# Patient Record
Sex: Male | Born: 1975 | Race: White | Hispanic: No | Marital: Married | Smoking: Former smoker
Health system: Southern US, Community
[De-identification: ages and names within clinical notes are randomized; demographics above are authoritative.]

## PROBLEM LIST (undated history)

## (undated) ENCOUNTER — Ambulatory Visit (HOSPITAL_COMMUNITY): Admission: EM

## (undated) DIAGNOSIS — I10 Essential (primary) hypertension: Secondary | ICD-10-CM

## (undated) DIAGNOSIS — J45909 Unspecified asthma, uncomplicated: Secondary | ICD-10-CM

## (undated) DIAGNOSIS — E785 Hyperlipidemia, unspecified: Secondary | ICD-10-CM

## (undated) DIAGNOSIS — G4730 Sleep apnea, unspecified: Secondary | ICD-10-CM

## (undated) DIAGNOSIS — D751 Secondary polycythemia: Secondary | ICD-10-CM

## (undated) HISTORY — PX: HERNIA REPAIR: SHX51

## (undated) HISTORY — DX: Hyperlipidemia, unspecified: E78.5

## (undated) HISTORY — DX: Essential (primary) hypertension: I10

## (undated) HISTORY — DX: Unspecified asthma, uncomplicated: J45.909

## (undated) HISTORY — DX: Sleep apnea, unspecified: G47.30

---

## 2008-02-18 HISTORY — PX: CARPAL TUNNEL RELEASE: SHX101

## 2019-03-21 ENCOUNTER — Other Ambulatory Visit: Payer: Self-pay

## 2019-03-22 ENCOUNTER — Encounter: Payer: Self-pay | Admitting: Family Medicine

## 2019-03-22 ENCOUNTER — Telehealth: Payer: Self-pay

## 2019-03-22 ENCOUNTER — Ambulatory Visit (INDEPENDENT_AMBULATORY_CARE_PROVIDER_SITE_OTHER): Payer: 59 | Admitting: Family Medicine

## 2019-03-22 VITALS — BP 134/90 | HR 86 | Temp 97.2°F | Ht 71.0 in | Wt 258.2 lb

## 2019-03-22 DIAGNOSIS — R7989 Other specified abnormal findings of blood chemistry: Secondary | ICD-10-CM

## 2019-03-22 DIAGNOSIS — J4530 Mild persistent asthma, uncomplicated: Secondary | ICD-10-CM | POA: Diagnosis not present

## 2019-03-22 DIAGNOSIS — J45909 Unspecified asthma, uncomplicated: Secondary | ICD-10-CM | POA: Insufficient documentation

## 2019-03-22 DIAGNOSIS — Z20822 Contact with and (suspected) exposure to covid-19: Secondary | ICD-10-CM | POA: Diagnosis not present

## 2019-03-22 DIAGNOSIS — N5201 Erectile dysfunction due to arterial insufficiency: Secondary | ICD-10-CM | POA: Diagnosis not present

## 2019-03-22 DIAGNOSIS — Z0000 Encounter for general adult medical examination without abnormal findings: Secondary | ICD-10-CM | POA: Diagnosis not present

## 2019-03-22 DIAGNOSIS — Z23 Encounter for immunization: Secondary | ICD-10-CM | POA: Diagnosis not present

## 2019-03-22 DIAGNOSIS — K4090 Unilateral inguinal hernia, without obstruction or gangrene, not specified as recurrent: Secondary | ICD-10-CM | POA: Diagnosis not present

## 2019-03-22 DIAGNOSIS — E7800 Pure hypercholesterolemia, unspecified: Secondary | ICD-10-CM

## 2019-03-22 LAB — URINALYSIS, ROUTINE W REFLEX MICROSCOPIC
Bilirubin Urine: NEGATIVE
Hgb urine dipstick: NEGATIVE
Ketones, ur: 40 — AB
Leukocytes,Ua: NEGATIVE
Nitrite: NEGATIVE
RBC / HPF: NONE SEEN (ref 0–?)
Specific Gravity, Urine: 1.01 (ref 1.000–1.030)
Total Protein, Urine: NEGATIVE
Urine Glucose: NEGATIVE
Urobilinogen, UA: 0.2 (ref 0.0–1.0)
WBC, UA: NONE SEEN (ref 0–?)
pH: 7 (ref 5.0–8.0)

## 2019-03-22 LAB — TSH: TSH: 2.78 u[IU]/mL (ref 0.35–4.50)

## 2019-03-22 LAB — COMPREHENSIVE METABOLIC PANEL
ALT: 136 U/L — ABNORMAL HIGH (ref 0–53)
AST: 99 U/L — ABNORMAL HIGH (ref 0–37)
Albumin: 4.6 g/dL (ref 3.5–5.2)
Alkaline Phosphatase: 52 U/L (ref 39–117)
BUN: 8 mg/dL (ref 6–23)
CO2: 27 mEq/L (ref 19–32)
Calcium: 9.3 mg/dL (ref 8.4–10.5)
Chloride: 97 mEq/L (ref 96–112)
Creatinine, Ser: 0.75 mg/dL (ref 0.40–1.50)
GFR: 112.57 mL/min (ref >60.00)
Glucose, Bld: 102 mg/dL — ABNORMAL HIGH (ref 70–99)
Potassium: 4 mEq/L (ref 3.5–5.1)
Sodium: 134 mEq/L — ABNORMAL LOW (ref 135–145)
Total Bilirubin: 0.7 mg/dL (ref 0.2–1.2)
Total Protein: 7 g/dL (ref 6.0–8.3)

## 2019-03-22 LAB — CBC
HCT: 45.9 % (ref 39.0–52.0)
Hemoglobin: 15.8 g/dL (ref 13.0–17.0)
MCHC: 34.4 g/dL (ref 30.0–36.0)
MCV: 99.4 fl (ref 78.0–100.0)
Platelets: 199 10*3/uL (ref 150.0–400.0)
RBC: 4.62 Mil/uL (ref 4.22–5.81)
RDW: 13.6 % (ref 11.5–15.5)
WBC: 4.1 10*3/uL (ref 4.0–10.5)

## 2019-03-22 LAB — LIPID PANEL
Cholesterol: 254 mg/dL — ABNORMAL HIGH (ref 0–200)
HDL: 72.4 mg/dL (ref >39.00)
LDL Cholesterol: 167 mg/dL — ABNORMAL HIGH (ref 0–99)
NonHDL: 181.49
Total CHOL/HDL Ratio: 4
Triglycerides: 72 mg/dL (ref 0.0–149.0)
VLDL: 14.4 mg/dL (ref 0.0–40.0)

## 2019-03-22 LAB — LDL CHOLESTEROL, DIRECT: Direct LDL: 157 mg/dL

## 2019-03-22 MED ORDER — ALBUTEROL SULFATE HFA 108 (90 BASE) MCG/ACT IN AERS: 1.0000 | INHALATION_SPRAY | Freq: Four times a day (QID) | RESPIRATORY_TRACT | 0 refills | Status: DC | PRN

## 2019-03-22 MED ORDER — PREDNISONE 20 MG PO TABS: 20.0000 mg | ORAL_TABLET | Freq: Two times a day (BID) | ORAL | 0 refills | Status: AC

## 2019-03-22 MED ORDER — SILDENAFIL CITRATE 20 MG PO TABS: ORAL_TABLET | ORAL | 1 refills | Status: DC

## 2019-03-22 NOTE — Progress Notes (Addendum)
New Patient Office Visit  Subjective:  Patient ID: Jordan Smith, male    DOB: 02-03-1976  Age: 44 y.o. MRN: [id number]  CC:  Chief Complaint  Patient presents with  . Establish Care    pt states that he was exposed to covid back in July tested negative few days after he had a fever of 103 x 4 days. Pt now diagnosed with asthma due to breathing issues and garling feeling in throat pt would like hernia on right side checked     HPI Brenden Manes presents for establishment of care, complete physical exam and follow-up for some medical issues that he has been having.  Currently he lives with his wife.  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  Has been able to see the dentist and the eye doctor over this past year.  He quit smoking 3 years ago.  It he has 1-2 drinks weekly.  He does not use illicit drugs.  Not currently exercising.  Developed a febrile illness back in July.  Was directly exposed to a person with Covid.  Since that time he has been experiencing wheezing.  He has no history of asthma.  Denies any lingering fever or sputum production.  He is using an albuterol inhaler daily.  He does snore at night.  He feels rested in the morning.  He also has been having problems with ED.  Libido has been okay.  Father's health history is unknown.  Mom has suffered a cerebral aneurysm encephalitis.  History reviewed. No pertinent past medical history.  Past Surgical History:  Procedure Laterality Date  . HERNIA REPAIR Left     Family History  Problem Relation Age of Onset  . Aneurysm Mother   . Hypothyroidism Mother   . Vitamin D deficiency Sister   . Cancer Maternal Aunt   . Cancer Maternal Uncle   . Glaucoma Maternal Grandfather     Social History   Socioeconomic History  . Marital status: Married    Spouse name: Not on file  . Number of children: Not on file  . Years of education: Not on file  . Highest education level: Not on file  Occupational History  . Not on file  Tobacco  Use  . Smoking status: Former Research scientist (life sciences)  . Smokeless tobacco: Former Network engineer and Sexual Activity  . Alcohol use: Yes    Alcohol/week: 1.0 - 2.0 standard drinks    Types: 1 - 2 Standard drinks or equivalent per week    Comment: social  . Drug use: Never  . Sexual activity: Yes  Other Topics Concern  . Not on file  Social History Narrative  . Not on file   Social Determinants of Health   Financial Resource Strain:   . Difficulty of Paying Living Expenses: Not on file  Food Insecurity:   . Worried About Charity fundraiser in the Last Year: Not on file  . Ran Out of Food in the Last Year: Not on file  Transportation Needs:   . Lack of Transportation (Medical): Not on file  . Lack of Transportation (Non-Medical): Not on file  Physical Activity:   . Days of Exercise per Week: Not on file  . Minutes of Exercise per Session: Not on file  Stress:   . Feeling of Stress : Not on file  Social Connections:   . Frequency of Communication with Friends and Family: Not on file  . Frequency of Social Gatherings with Friends and Family:  Not on file  . Attends Religious Services: Not on file  . Active Member of Clubs or Organizations: Not on file  . Attends Archivist Meetings: Not on file  . Marital Status: Not on file  Intimate Partner Violence:   . Fear of Current or Ex-Partner: Not on file  . Emotionally Abused: Not on file  . Physically Abused: Not on file  . Sexually Abused: Not on file    ROS Review of Systems  Constitutional: Negative for diaphoresis, fatigue, fever and unexpected weight change.  HENT: Negative.   Eyes: Negative for photophobia and visual disturbance.  Respiratory: Positive for cough and wheezing. Negative for shortness of breath.   Cardiovascular: Negative.   Gastrointestinal: Negative.   Endocrine: Negative for polyphagia and polyuria.  Genitourinary: Negative for difficulty urinating, frequency and urgency.  Musculoskeletal: Negative for  gait problem and joint swelling.  Skin: Negative for pallor and rash.  Allergic/Immunologic: Negative for immunocompromised state.  Neurological: Negative for tremors and speech difficulty.  Hematological: Does not bruise/bleed easily.  Psychiatric/Behavioral: Negative.     Objective:   Today's Vitals: BP 134/90   Pulse 86   Temp (!) 97.2 F (36.2 C) (Tympanic)   Ht 5\' 11"  (1.803 m)   Wt 258 lb 3.2 oz (117.1 kg)   SpO2 97%   BMI 36.01 kg/m   Physical Exam Vitals and nursing note reviewed.  Constitutional:      General: He is not in acute distress.    Appearance: Normal appearance. He is not ill-appearing, toxic-appearing or diaphoretic.  HENT:     Head: Normocephalic and atraumatic.     Right Ear: Tympanic membrane and ear canal normal.     Left Ear: Tympanic membrane and ear canal normal.     Mouth/Throat:     Mouth: Mucous membranes are dry.     Pharynx: Oropharynx is clear. No oropharyngeal exudate or posterior oropharyngeal erythema.  Eyes:     General: No scleral icterus.       Right eye: No discharge.        Left eye: No discharge.     Extraocular Movements: Extraocular movements intact.     Conjunctiva/sclera: Conjunctivae normal.     Pupils: Pupils are equal, round, and reactive to light.  Neck:     Vascular: No carotid bruit.  Cardiovascular:     Rate and Rhythm: Normal rate and regular rhythm.  Pulmonary:     Effort: No respiratory distress.     Breath sounds: No stridor. No wheezing, rhonchi or rales.  Chest:     Chest wall: No tenderness.  Abdominal:     General: Abdomen is flat. Bowel sounds are normal. There is no distension.     Palpations: Abdomen is soft.     Tenderness: There is no abdominal tenderness. There is no guarding or rebound.     Hernia: A hernia is present. Hernia is present in the right inguinal area. There is no hernia in the left inguinal area.  Genitourinary:    Penis: Circumcised. No hypospadias, erythema, tenderness, discharge,  swelling or lesions.      Testes: Normal.        Right: Mass, tenderness or swelling not present. Right testis is descended.        Left: Tenderness or swelling not present. Left testis is descended.  Musculoskeletal:     Cervical back: Neck supple. No rigidity.     Right lower leg: No edema.     Left lower  leg: No edema.  Lymphadenopathy:     Cervical: No cervical adenopathy.     Lower Body: No right inguinal adenopathy. No left inguinal adenopathy.  Skin:    General: Skin is warm and dry.  Neurological:     Mental Status: He is oriented to person, place, and time.  Psychiatric:        Mood and Affect: Mood normal.        Behavior: Behavior normal.     Assessment & Plan:   Problem List Items Addressed This Visit      Cardiovascular and Mediastinum   Erectile dysfunction due to arterial insufficiency   Relevant Medications   sildenafil (REVATIO) 20 MG tablet   Other Relevant Orders   LDL cholesterol, direct (Completed)   Lipid panel (Completed)   Testosterone     Respiratory   Reactive airway disease   Relevant Medications   predniSONE (DELTASONE) 20 MG tablet   albuterol (VENTOLIN HFA) 108 (90 Base) MCG/ACT inhaler   Other Relevant Orders   Ambulatory referral to Pulmonology     Other   Unilateral inguinal hernia without obstruction or gangrene   Relevant Orders   Ambulatory referral to [place] maintenance - Primary   Relevant Orders   CBC (Completed)   Comprehensive metabolic panel (Completed)   LDL cholesterol, direct (Completed)   Lipid panel (Completed)   TSH (Completed)   Urinalysis, Routine w reflex microscopic (Completed)   HIV Antibody (routine testing w rflx)   Flu Vaccine QUAD 6+ mos PF IM (Fluarix Quad PF) (Completed)   Tdap vaccine greater than or equal to 7yo IM (Completed)   Elevated LFTs   Relevant Orders   US Abdomen Complete   Close exposure to COVID-19 virus   Relevant Orders   SAR CoV2 Serology (COVID 19)AB(IGG)IA     Elevated LDL cholesterol level      Outpatient Encounter Medications as of 03/22/2019  Medication Sig  . cetirizine (ZYRTEC) 10 MG tablet Take 10 mg by mouth daily.  . [DISCONTINUED] albuterol (VENTOLIN HFA) 108 (90 Base) MCG/ACT inhaler Inhale 1 puff into the lungs every 6 (six) hours as needed for wheezing or shortness of breath.  Marland Kitchen albuterol (VENTOLIN HFA) 108 (90 Base) MCG/ACT inhaler Inhale 1-2 puffs into the lungs every 6 (six) hours as needed for wheezing or shortness of breath.  . predniSONE (DELTASONE) 20 MG tablet Take 1 tablet (20 mg total) by mouth 2 (two) times daily with a meal for 7 days.  . sildenafil (REVATIO) 20 MG tablet Take one to three tablets prior daily as needed.  . [DISCONTINUED] albuterol (VENTOLIN HFA) 108 (90 Base) MCG/ACT inhaler Inhale 90 puffs into the lungs daily.   No facility-administered encounter medications on file as of 03/22/2019.    Follow-up: Return in about 3 months (around 06/19/2019), or if symptoms worsen or fail to improve.   Patient was given information on health maintenance and disease prevention.  Referred to pulmonology for evaluation of sleep apnea and COPD.  Consider the possibility of post Covid reactive airway disease.  General surgery evaluation for right inguinal bulge.  Refilled albuterol inhaler.  We will try prednisone 20 mg twice daily for the reactive airway disease.  Patient has taken prednisone before.  Libby Maw, MD

## 2019-03-22 NOTE — Addendum Note (Signed)
Addended by: Jon Billings on: 03/22/2019 02:38 PM   Modules accepted: Orders

## 2019-03-22 NOTE — Patient Instructions (Signed)
Health Maintenance, Male Adopting a healthy lifestyle and getting preventive care are important in promoting health and wellness. Ask your health care provider about:  The right schedule for you to have regular tests and exams.  Things you can do on your own to prevent diseases and keep yourself healthy. What should I know about diet, weight, and exercise? Eat a healthy diet   Eat a diet that includes plenty of vegetables, fruits, low-fat dairy products, and lean protein.  Do not eat a lot of foods that are high in solid fats, added sugars, or sodium. Maintain a healthy weight Body mass index (BMI) is a measurement that can be used to identify possible weight problems. It estimates body fat based on height and weight. Your health care provider can help determine your BMI and help you achieve or maintain a healthy weight. Get regular exercise Get regular exercise. This is one of the most important things you can do for your health. Most adults should:  Exercise for at least 150 minutes each week. The exercise should increase your heart rate and make you sweat (moderate-intensity exercise).  Do strengthening exercises at least twice a week. This is in addition to the moderate-intensity exercise.  Spend less time sitting. Even light physical activity can be beneficial. Watch cholesterol and blood lipids Have your blood tested for lipids and cholesterol at 46 years of age, then have this test every 5 years. You may need to have your cholesterol levels checked more often if:  Your lipid or cholesterol levels are high.  You are older than 46 years of age.  You are at high risk for heart disease. What should I know about cancer screening? Many types of cancers can be detected early and may often be prevented. Depending on your health history and family history, you may need to have cancer screening at various ages. This may include screening for:  Colorectal cancer.  Prostate  cancer.  Skin cancer.  Lung cancer. What should I know about heart disease, diabetes, and high blood pressure? Blood pressure and heart disease  High blood pressure causes heart disease and increases the risk of stroke. This is more likely to develop in people who have high blood pressure readings, are of African descent, or are overweight.  Talk with your health care provider about your target blood pressure readings.  Have your blood pressure checked: ? Every 3-5 years if you are 18-39 years of age. ? Every year if you are 40 years old or older.  If you are between the ages of 65 and 75 and are a current or former smoker, ask your health care provider if you should have a one-time screening for abdominal aortic aneurysm (AAA). Diabetes Have regular diabetes screenings. This checks your fasting blood sugar level. Have the screening done:  Once every three years after age 45 if you are at a normal weight and have a low risk for diabetes.  More often and at a younger age if you are overweight or have a high risk for diabetes. What should I know about preventing infection? Hepatitis B If you have a higher risk for hepatitis B, you should be screened for this virus. Talk with your health care provider to find out if you are at risk for hepatitis B infection. Hepatitis C Blood testing is recommended for:  Everyone born from 1945 through 1965.  Anyone with known risk factors for hepatitis C. Sexually transmitted infections (STIs)  You should be screened each year   for STIs, including gonorrhea and chlamydia, if: ? You are sexually active and are younger than 46 years of age. ? You are older than 46 years of age and your health care provider tells you that you are at risk for this type of infection. ? Your sexual activity has changed since you were last screened, and you are at increased risk for chlamydia or gonorrhea. Ask your health care provider if you are at risk.  Ask your  health care provider about whether you are at high risk for HIV. Your health care provider may recommend a prescription medicine to help prevent HIV infection. If you choose to take medicine to prevent HIV, you should first get tested for HIV. You should then be tested every 3 months for as long as you are taking the medicine. Follow these instructions at home: Lifestyle  Do not use any products that contain nicotine or tobacco, such as cigarettes, e-cigarettes, and chewing tobacco. If you need help quitting, ask your health care provider.  Do not use street drugs.  Do not share needles.  Ask your health care provider for help if you need support or information about quitting drugs. Alcohol use  Do not drink alcohol if your health care provider tells you not to drink.  If you drink alcohol: ? Limit how much you have to 0-2 drinks a day. ? Be aware of how much alcohol is in your drink. In the [place] one drink equals one 12 oz bottle of beer (355 mL), one 5 oz glass of wine (148 mL), or one 1 oz glass of hard liquor (44 mL). General instructions  Schedule regular health, dental, and eye exams.  Stay current with your vaccines.  Tell your health care provider if: ? You often feel depressed. ? You have ever been abused or do not feel safe at home. Summary  Adopting a healthy lifestyle and getting preventive care are important in promoting health and wellness.  Follow your health care provider's instructions about healthy diet, exercising, and getting tested or screened for diseases.  Follow your health care provider's instructions on monitoring your cholesterol and blood pressure. This information is not intended to replace advice given to you by your health care provider. Make sure you discuss any questions you have with your health care provider. Document Revised: 01/27/2018 Document Reviewed: 01/27/2018 Elsevier Patient Education  2020 [employer]  Preventive Care 40-64 Years  Old, Male Preventive care refers to lifestyle choices and visits with your health care provider that can promote health and wellness. This includes:  A yearly physical exam. This is also called an annual well check.  Regular dental and eye exams.  Immunizations.  Screening for certain conditions.  Healthy lifestyle choices, such as eating a healthy diet, getting regular exercise, not using drugs or products that contain nicotine and tobacco, and limiting alcohol use. What can I expect for my preventive care visit? Physical exam Your health care provider will check:  Height and weight. These may be used to calculate body mass index (BMI), which is a measurement that tells if you are at a healthy weight.  Heart rate and blood pressure.  Your skin for abnormal spots. Counseling Your health care provider may ask you questions about:  Alcohol, tobacco, and drug use.  Emotional well-being.  Home and relationship well-being.  Sexual activity.  Eating habits.  Work and work environment. What immunizations do I need?  Influenza (flu) vaccine  This is recommended every year. Tetanus, diphtheria,   and pertussis (Tdap) vaccine  You may need a Td booster every 10 years. Varicella (chickenpox) vaccine  You may need this vaccine if you have not already been vaccinated. Zoster (shingles) vaccine  You may need this after age 63. Measles, mumps, and rubella (MMR) vaccine  You may need at least one dose of MMR if you were born in 1957 or later. You may also need a second dose. Pneumococcal conjugate (PCV13) vaccine  You may need this if you have certain conditions and were not previously vaccinated. Pneumococcal polysaccharide (PPSV23) vaccine  You may need one or two doses if you smoke cigarettes or if you have certain conditions. Meningococcal conjugate (MenACWY) vaccine  You may need this if you have certain conditions. Hepatitis A vaccine  You may need this if you have  certain conditions or if you travel or work in places where you may be exposed to hepatitis A. Hepatitis B vaccine  You may need this if you have certain conditions or if you travel or work in places where you may be exposed to hepatitis B. Haemophilus influenzae type b (Hib) vaccine  You may need this if you have certain risk factors. Human papillomavirus (HPV) vaccine  If recommended by your health care provider, you may need three doses over 6 months. You may receive vaccines as individual doses or as more than one vaccine together in one shot (combination vaccines). Talk with your health care provider about the risks and benefits of combination vaccines. What tests do I need? Blood tests  Lipid and cholesterol levels. These may be checked every 5 years, or more frequently if you are over 68 years old.  Hepatitis C test.  Hepatitis B test. Screening  Lung cancer screening. You may have this screening every year starting at age 78 if you have a 30-pack-year history of smoking and currently smoke or have quit within the past 15 years.  Prostate cancer screening. Recommendations will vary depending on your family history and other risks.  Colorectal cancer screening. All adults should have this screening starting at age 38 and continuing until age 22. Your health care provider may recommend screening at age 73 if you are at increased risk. You will have tests every 1-10 years, depending on your results and the type of screening test.  Diabetes screening. This is done by checking your blood sugar (glucose) after you have not eaten for a while (fasting). You may have this done every 1-3 years.  Sexually transmitted disease (STD) testing. Follow these instructions at home: Eating and drinking  Eat a diet that includes fresh fruits and vegetables, whole grains, lean protein, and low-fat dairy products.  Take vitamin and mineral supplements as recommended by your health care  provider.  Do not drink alcohol if your health care provider tells you not to drink.  If you drink alcohol: ? Limit how much you have to 0-2 drinks a day. ? Be aware of how much alcohol is in your drink. In the [place] one drink equals one 12 oz bottle of beer (355 mL), one 5 oz glass of wine (148 mL), or one 1 oz glass of hard liquor (44 mL). Lifestyle  Take daily care of your teeth and gums.  Stay active. Exercise for at least 30 minutes on 5 or more days each week.  Do not use any products that contain nicotine or tobacco, such as cigarettes, e-cigarettes, and chewing tobacco. If you need help quitting, ask your health care provider.  If  you are sexually active, practice safe sex. Use a condom or other form of protection to prevent STIs (sexually transmitted infections).  Talk with your health care provider about taking a low-dose aspirin every day starting at age 50. What's next?  Go to your health care provider once a year for a well check visit.  Ask your health care provider how often you should have your eyes and teeth checked.  Stay up to date on all vaccines. This information is not intended to replace advice given to you by your health care provider. Make sure you discuss any questions you have with your health care provider. Document Revised: 01/28/2018 Document Reviewed: 01/28/2018 Elsevier Patient Education  2020 [employer] Inc.  

## 2019-03-22 NOTE — Telephone Encounter (Signed)
Spoke with patient went over lab results patient verbally understood all. And want to let doctor know that he is currently on a Keto diet which he's been on for 5 weeks now, per pt he only eats leafy greens, raw vegetables, and protein. No breads or fried foods. Not sure why is glucose was at 102 he only had half cup of black coffee. I let pt know that it wasn't extremely elevated, will follow up with all labs. Patient also aware that [facility] referral sent for elevated enzymes.

## 2019-03-22 NOTE — Telephone Encounter (Signed)
-----   Message from Libby Maw, MD sent at 03/22/2019  2:40 PM EST ----- Initial labs showed significantly elevated ldl or bad cholesterol. Please lower fat and cholesterol in diet. We will discuss further in 3 months.   Liver enzymes are elevated. I have ordered a [place] of your liver.

## 2019-03-23 LAB — HIV ANTIBODY (ROUTINE TESTING W REFLEX): HIV 1&2 Ab, 4th Generation: NONREACTIVE

## 2019-03-23 LAB — SAR COV2 SEROLOGY (COVID19)AB(IGG),IA: SARS CoV2 AB IGG: NEGATIVE

## 2019-03-23 NOTE — Telephone Encounter (Signed)
Agreed. Not worried about minor glucose elevation.

## 2019-03-28 ENCOUNTER — Encounter: Payer: Self-pay | Admitting: Family Medicine

## 2019-04-05 ENCOUNTER — Ambulatory Visit
Admission: RE | Admit: 2019-04-05 | Discharge: 2019-04-05 | Disposition: A | Discharge status: Home / Self Care | Payer: 59 | Source: Ambulatory Visit | Attending: Family Medicine | Admitting: Family Medicine

## 2019-04-05 DIAGNOSIS — R7989 Other specified abnormal findings of blood chemistry: Secondary | ICD-10-CM

## 2019-04-13 ENCOUNTER — Encounter: Payer: Self-pay | Admitting: Family Medicine

## 2019-04-13 ENCOUNTER — Telehealth (INDEPENDENT_AMBULATORY_CARE_PROVIDER_SITE_OTHER): Payer: 59 | Admitting: Family Medicine

## 2019-04-13 VITALS — Temp 97.0°F | Ht 71.0 in | Wt 258.0 lb

## 2019-04-13 DIAGNOSIS — R7989 Other specified abnormal findings of blood chemistry: Secondary | ICD-10-CM | POA: Diagnosis not present

## 2019-04-13 DIAGNOSIS — J4520 Mild intermittent asthma, uncomplicated: Secondary | ICD-10-CM

## 2019-04-13 DIAGNOSIS — Z6835 Body mass index (BMI) 35.0-35.9, adult: Secondary | ICD-10-CM

## 2019-04-13 DIAGNOSIS — N5201 Erectile dysfunction due to arterial insufficiency: Secondary | ICD-10-CM

## 2019-04-13 DIAGNOSIS — K760 Fatty (change of) liver, not elsewhere classified: Secondary | ICD-10-CM | POA: Diagnosis not present

## 2019-04-13 DIAGNOSIS — E6609 Other obesity due to excess calories: Secondary | ICD-10-CM

## 2019-04-13 NOTE — Patient Instructions (Signed)
Fatty Liver Disease  Fatty liver disease occurs when too much fat has built up in your liver cells. Fatty liver disease is also called hepatic steatosis or steatohepatitis. The liver removes harmful substances from your bloodstream and produces fluids that your body needs. It also helps your body use and store energy from the food you eat. In many cases, fatty liver disease does not cause symptoms or problems. It is often diagnosed when tests are being done for other reasons. However, over time, fatty liver can cause inflammation that may lead to more serious liver problems, such as scarring of the liver (cirrhosis) and liver failure. Fatty liver is associated with insulin resistance, increased body fat, high blood pressure (hypertension), and high cholesterol. These are features of metabolic syndrome and increase your risk for stroke, diabetes, and heart disease. What are the causes? This condition may be caused by:  Drinking too much alcohol.  Poor nutrition.  Obesity.  Cushing's syndrome.  Diabetes.  High cholesterol.  Certain drugs.  Poisons.  Some viral infections.  Pregnancy. What increases the risk? You are more likely to develop this condition if you:  Abuse alcohol.  Are overweight.  Have diabetes.  Have hepatitis.  Have a high triglyceride level.  Are pregnant. What are the signs or symptoms? Fatty liver disease often does not cause symptoms. If symptoms do develop, they can include:  Fatigue.  Weakness.  Weight loss.  Confusion.  Abdominal pain.  Nausea and vomiting.  Yellowing of your skin and the white parts of your eyes (jaundice).  Itchy skin. How is this diagnosed? This condition may be diagnosed by:  A physical exam and medical history.  Blood tests.  Imaging tests, such as an ultrasound, CT scan, or MRI.  A liver biopsy. A small sample of liver tissue is removed using a needle. The sample is then looked at under a microscope. How  is this treated? Fatty liver disease is often caused by other health conditions. Treatment for fatty liver may involve medicines and lifestyle changes to manage conditions such as:  Alcoholism.  High cholesterol.  Diabetes.  Being overweight or obese. Follow these instructions at home:   Do not drink alcohol. If you have trouble quitting, ask your health care provider how to safely quit with the help of medicine or a supervised program. This is important to keep your condition from getting worse.  Eat a healthy diet as told by your health care provider. Ask your health care provider about working with a diet and nutrition specialist (dietitian) to develop an eating plan.  Exercise regularly. This can help you lose weight and control your cholesterol and diabetes. Talk to your health care provider about an exercise plan and which activities are best for you.  Take over-the-counter and prescription medicines only as told by your health care provider.  Keep all follow-up visits as told by your health care provider. This is important. Contact a health care provider if: You have trouble controlling your:  Blood sugar. This is especially important if you have diabetes.  Cholesterol.  Drinking of alcohol. Get help right away if:  You have abdominal pain.  You have jaundice.  You have nausea and vomiting.  You vomit blood or material that looks like coffee grounds.  You have stools that are black, tar-like, or bloody. Summary  Fatty liver disease develops when too much fat builds up in the cells of your liver.  Fatty liver disease often causes no symptoms or problems. However, over   time, fatty liver can cause inflammation that may lead to more serious liver problems, such as scarring of the liver (cirrhosis).  You are more likely to develop this condition if you abuse alcohol, are pregnant, are overweight, have diabetes, have hepatitis, or have high triglyceride  levels.  Contact your health care provider if you have trouble controlling your weight, blood sugar, cholesterol, or drinking of alcohol. This information is not intended to replace advice given to you by your health care provider. Make sure you discuss any questions you have with your health care provider. Document Revised: 01/16/2017 Document Reviewed: 11/12/2016 Elsevier Patient Education  2020 Elsevier Inc.  

## 2019-04-13 NOTE — Progress Notes (Signed)
Established Patient Office Visit  Subjective:  Patient ID: Jordan Smith, male    DOB: [birth year]  Age: 44 y.o. MRN: [id number]  CC:  Chief Complaint  Patient presents with  . Follow-up    follow up/discuss labs    HPI Devine Scurto presents for follow-up of his hepatic steatosis, obesity, alcohol use, question of asthma and apnea.  He also has a strong family history of vitamin D deficiency.  He has been using the keto diet for weight loss without much success he tells me.  Also today he tells me that he is drinking 2 or 3 drinks every other day during the week.  Triglycerides were 72.  BMI 35.  He does snore loudly and his wife tells him.  Does not always feel rested in the morning.   History reviewed. No pertinent past medical history.  Past Surgical History:  Procedure Laterality Date  . HERNIA REPAIR Left     Family History  Problem Relation Age of Onset  . Aneurysm Mother   . Hypothyroidism Mother   . Vitamin D deficiency Sister   . Cancer Maternal Aunt   . Cancer Maternal Uncle   . Glaucoma Maternal Grandfather     Social History   Socioeconomic History  . Marital status: Married    Spouse name: Not on file  . Number of children: Not on file  . Years of education: Not on file  . Highest education level: Not on file  Occupational History  . Not on file  Tobacco Use  . Smoking status: Former Research scientist (life sciences)  . Smokeless tobacco: Former Network engineer and Sexual Activity  . Alcohol use: Yes    Alcohol/week: 1.0 - 2.0 standard drinks    Types: 1 - 2 Standard drinks or equivalent per week    Comment: social  . Drug use: Never  . Sexual activity: Yes  Other Topics Concern  . Not on file  Social History Narrative  . Not on file   Social Determinants of Health   Financial Resource Strain:   . Difficulty of Paying Living Expenses: Not on file  Food Insecurity:   . Worried About Charity fundraiser in the Last Year: Not on file  . Ran Out of Food in the Last  Year: Not on file  Transportation Needs:   . Lack of Transportation (Medical): Not on file  . Lack of Transportation (Non-Medical): Not on file  Physical Activity:   . Days of Exercise per Week: Not on file  . Minutes of Exercise per Session: Not on file  Stress:   . Feeling of Stress : Not on file  Social Connections:   . Frequency of Communication with Friends and Family: Not on file  . Frequency of Social Gatherings with Friends and Family: Not on file  . Attends Religious Services: Not on file  . Active Member of Clubs or Organizations: Not on file  . Attends Archivist Meetings: Not on file  . Marital Status: Not on file  Intimate Partner Violence:   . Fear of Current or Ex-Partner: Not on file  . Emotionally Abused: Not on file  . Physically Abused: Not on file  . Sexually Abused: Not on file    Outpatient Medications Prior to Visit  Medication Sig Dispense Refill  . albuterol (VENTOLIN HFA) 108 (90 Base) MCG/ACT inhaler Inhale 1-2 puffs into the lungs every 6 (six) hours as needed for wheezing or shortness of breath. 18 g 0  .  cetirizine (ZYRTEC) 10 MG tablet Take 10 mg by mouth daily.    . sildenafil (REVATIO) 20 MG tablet Take one to three tablets prior daily as needed. 25 tablet 1   No facility-administered medications prior to visit.    Not on File  ROS Review of Systems  Constitutional: Negative.   Respiratory: Negative.   Cardiovascular: Negative.   Gastrointestinal: Negative.   Genitourinary: Negative.   Psychiatric/Behavioral: Negative.       Objective:    Physical Exam  Constitutional: He is oriented to person, place, and time. He appears well-developed and well-nourished. No distress.  HENT:  Head: Normocephalic and atraumatic.  Right Ear: External ear normal.  Left Ear: External ear normal.  Eyes: Conjunctivae are normal. Right eye exhibits no discharge. Left eye exhibits no discharge. No scleral icterus.  Neck: No JVD present. No  tracheal deviation present.  Pulmonary/Chest: Effort normal. No stridor.  Neurological: He is alert and oriented to person, place, and time.  Skin: He is not diaphoretic.  Psychiatric: He has a normal mood and affect. His behavior is normal.    Temp (!) 97 F (36.1 C) (Tympanic) Comment: per pt  Ht 5\' 11"  (1.803 m)   Wt 258 lb (117 kg) Comment: per pt  BMI 35.98 kg/m  Wt Readings from Last 3 Encounters:  04/13/19 258 lb (117 kg)  03/22/19 258 lb 3.2 oz (117.1 kg)     There are no preventive care reminders to display for this patient.  There are no preventive care reminders to display for this patient.  Lab Results  Component Value Date   TSH 2.78 03/22/2019   Lab Results  Component Value Date   WBC 4.1 03/22/2019   HGB 15.8 03/22/2019   HCT 45.9 03/22/2019   MCV 99.4 03/22/2019   PLT 199.0 03/22/2019   Lab Results  Component Value Date   NA 134 (L) 03/22/2019   K 4.0 03/22/2019   CO2 27 03/22/2019   GLUCOSE 102 (H) 03/22/2019   BUN 8 03/22/2019   CREATININE 0.75 03/22/2019   BILITOT 0.7 03/22/2019   ALKPHOS 52 03/22/2019   AST 99 (H) 03/22/2019   ALT 136 (H) 03/22/2019   PROT 7.0 03/22/2019   ALBUMIN 4.6 03/22/2019   CALCIUM 9.3 03/22/2019   GFR 112.57 03/22/2019   Lab Results  Component Value Date   CHOL 254 (H) 03/22/2019   Lab Results  Component Value Date   HDL 72.40 03/22/2019   Lab Results  Component Value Date   LDLCALC 167 (H) 03/22/2019   Lab Results  Component Value Date   TRIG 72.0 03/22/2019   Lab Results  Component Value Date   CHOLHDL 4 03/22/2019   No results found for: HGBA1C    Assessment & Plan:   Problem List Items Addressed This Visit      Cardiovascular and Mediastinum   Erectile dysfunction due to arterial insufficiency   Relevant Orders   Testosterone     Respiratory   Mild intermittent asthma without complication   Relevant Orders   Ambulatory referral to Pulmonology     Digestive   Hepatic steatosis    Relevant Orders   Hepatitis B surface antibody,qualitative   Hepatitis C antibody   Gamma GT   Hepatic function panel     Other   Elevated LFTs - Primary   Relevant Orders   Hepatitis B surface antibody,qualitative   Hepatitis C antibody   Gamma GT   Hepatic function panel   Class  2 obesity due to excess calories with body mass index (BMI) of 35.0 to 35.9 in adult   Relevant Orders   VITAMIN D 25 Hydroxy (Vit-D Deficiency, Fractures)      No orders of the defined types were placed in this encounter.   Follow-up: Return in about 6 months (around 10/11/2019).   We had a long discussion about hepatic steatosis.  He is now aware that it could impact his health in the future and he is at risk for developing cirrhosis and/or liver failure.  Advised him to stop drinking completely.  He is going to try to lose weight.  Offered referral for weight loss management.  He declined for now and wants to try on his own.  Suggested weight watchers.  He will return for further blood work.  Information on fatty liver disease was given.  Pulmonary referral for evaluation of asthma and/or apnea. Libby Maw, MD

## 2019-04-14 ENCOUNTER — Ambulatory Visit: Payer: 59 | Admitting: Family Medicine

## 2019-04-14 ENCOUNTER — Other Ambulatory Visit: Payer: Self-pay | Admitting: Family Medicine

## 2019-04-14 DIAGNOSIS — J4530 Mild persistent asthma, uncomplicated: Secondary | ICD-10-CM

## 2019-05-03 ENCOUNTER — Encounter: Payer: Self-pay | Admitting: Family Medicine

## 2019-05-03 NOTE — Telephone Encounter (Signed)
Vaccine manufacturers claim that up to 50% of people can experience flu like symptoms, typically after the 2nd dose. For most people these last a day or so.

## 2019-05-05 ENCOUNTER — Ambulatory Visit: Payer: 59 | Attending: Internal Medicine

## 2019-05-05 DIAGNOSIS — Z23 Encounter for immunization: Secondary | ICD-10-CM

## 2019-05-05 NOTE — Progress Notes (Signed)
   Covid-19 Vaccination Clinic  Name:  Spurgeon Ehly    MRN: [id number] DOB: 07-11-1975  05/05/2019  Mr. Haberland was observed post Covid-19 immunization for 30 minutes based on pre-vaccination screening without incident. He was provided with Vaccine Information Sheet and instruction to access the V-Safe system.   Mr. Gathright was instructed to call 911 with any severe reactions post vaccine: Marland Kitchen Difficulty breathing  . Swelling of face and throat  . A fast heartbeat  . A bad rash all over body  . Dizziness and weakness   Immunizations Administered    Name Date Dose VIS Date Route   Pfizer COVID-19 Vaccine 05/05/2019  4:34 PM 0.3 mL 01/28/2019 Intramuscular   Manufacturer: [place]   Lot: [id number]   [place]: [id number]

## 2019-05-31 ENCOUNTER — Ambulatory Visit: Payer: 59 | Attending: Internal Medicine

## 2019-05-31 DIAGNOSIS — Z23 Encounter for immunization: Secondary | ICD-10-CM

## 2019-05-31 NOTE — Progress Notes (Signed)
   Covid-19 Vaccination Clinic  Name:  Jordan Smith    MRN: [id number] DOB: 1975-06-09  05/31/2019  [name] was observed post Covid-19 immunization for 15 minutes without incident. He was provided with Vaccine Information Sheet and instruction to access the V-Safe system.   Mr. Story was instructed to call 911 with any severe reactions post vaccine: Marland Kitchen Difficulty breathing  . Swelling of face and throat  . A fast heartbeat  . A bad rash all over body  . Dizziness and weakness   Immunizations Administered    Name Date Dose VIS Date Route   Pfizer COVID-19 Vaccine 05/31/2019  4:44 PM 0.3 mL 01/28/2019 Intramuscular   Manufacturer: [place]   Lot: [id number]   [place]: [id number]

## 2019-06-21 ENCOUNTER — Ambulatory Visit: Payer: 59 | Admitting: Family Medicine

## 2021-01-31 IMAGING — US US ABDOMEN COMPLETE
1 series · 14 of 25 positions shown · non-contrast
Comparison: None.

CLINICAL DATA: Elevated LFTs.

EXAM:
ABDOMEN ULTRASOUND COMPLETE

[Series 1: us abdomen complete · 0.28mm/px · 14 of 78 slices shown]
[im 1/78]
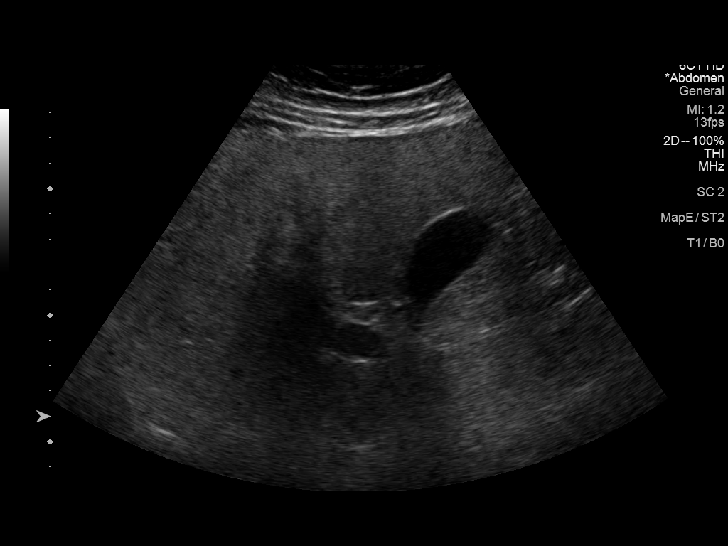
[im 7/78]
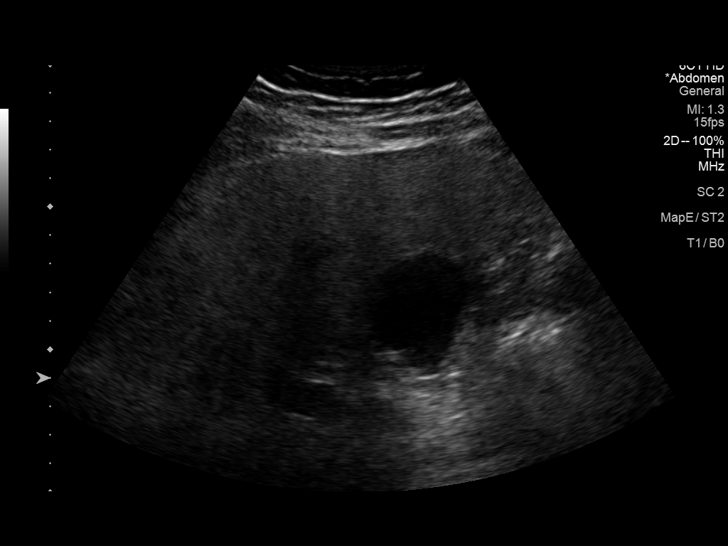
[im 13/78]
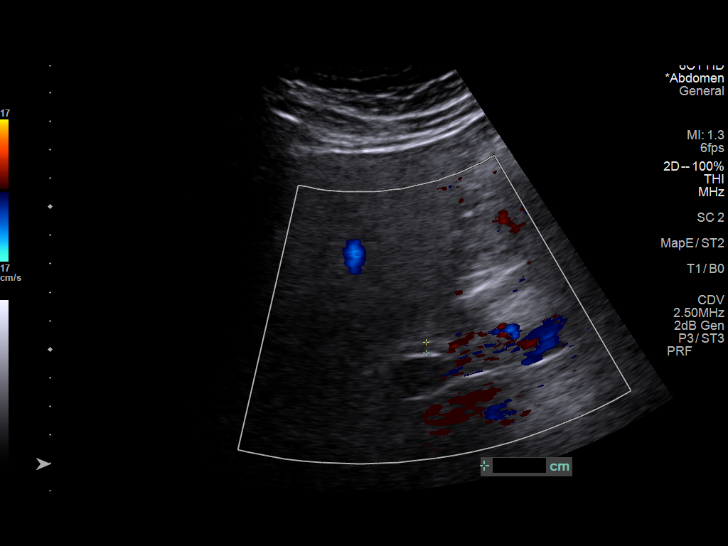
[im 20/78]
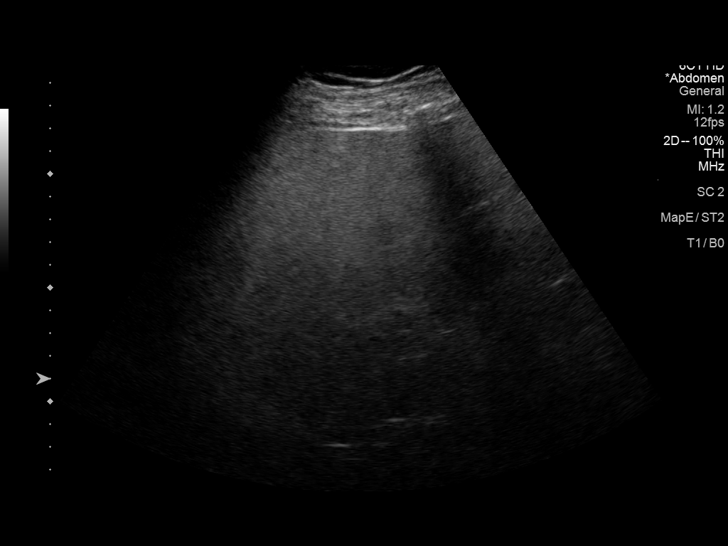
[im 26/78]
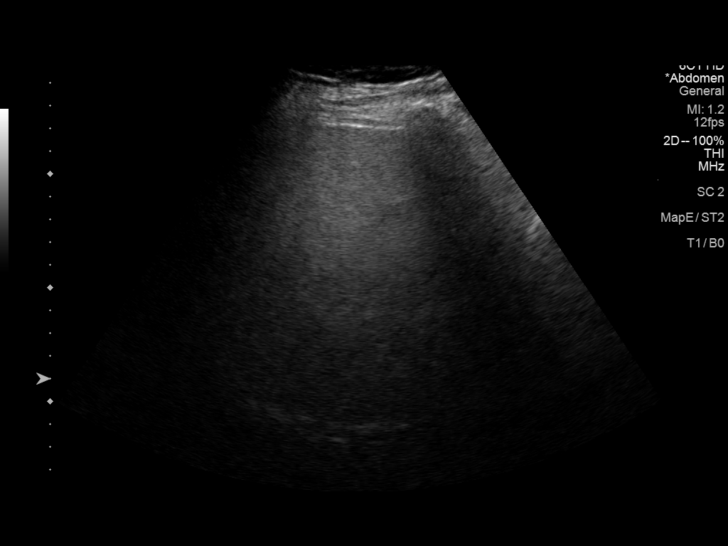
[im 29/78]
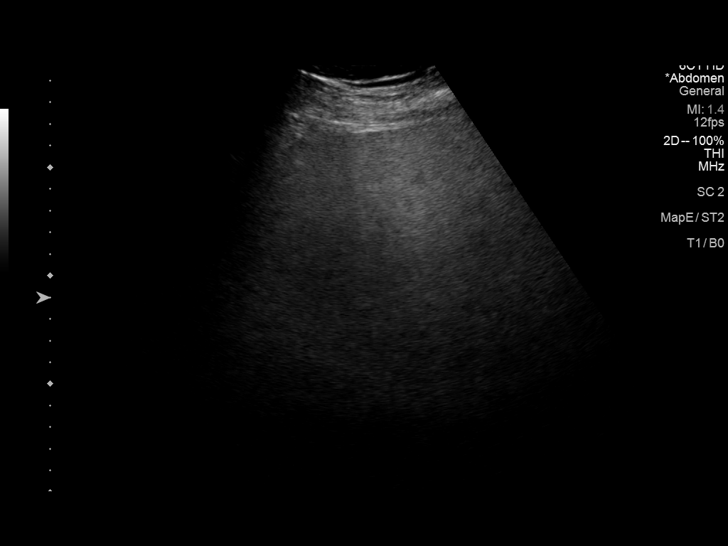
[im 36/78]
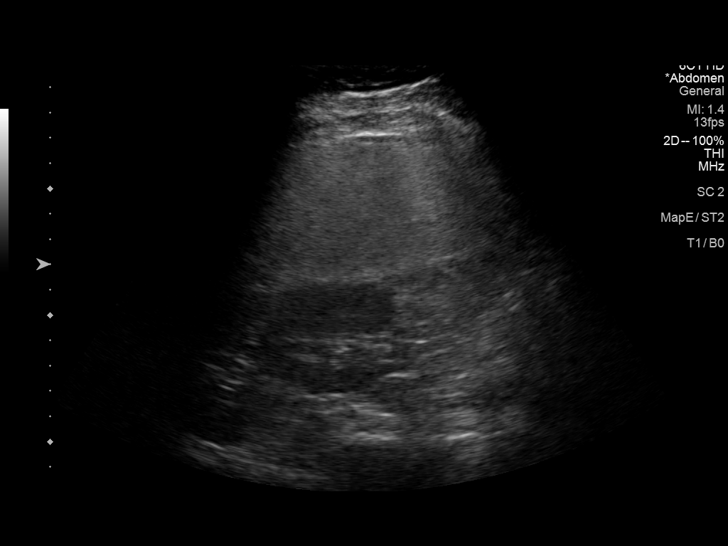
[im 42/78]
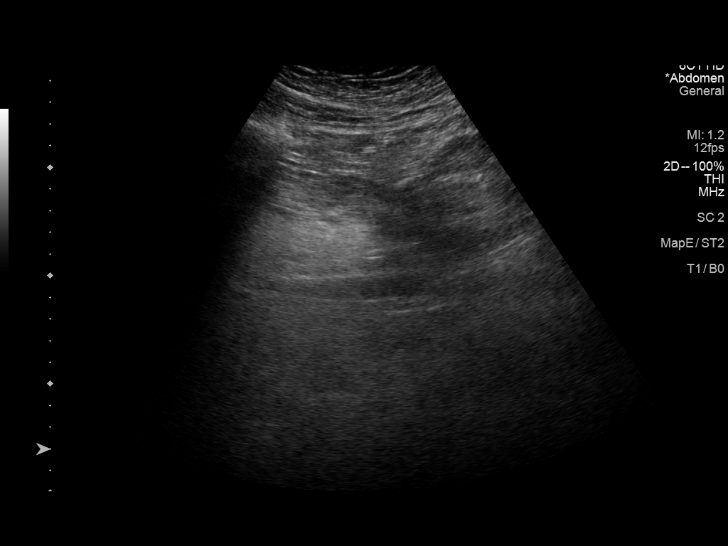
[im 49/78]
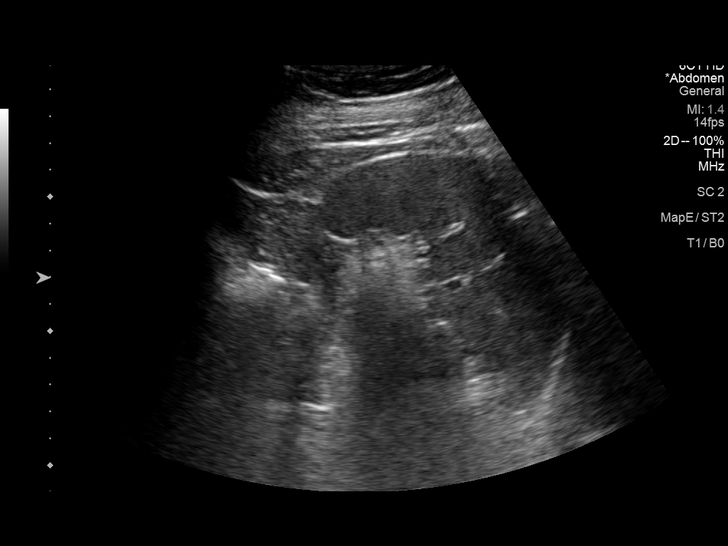
[im 52/78]
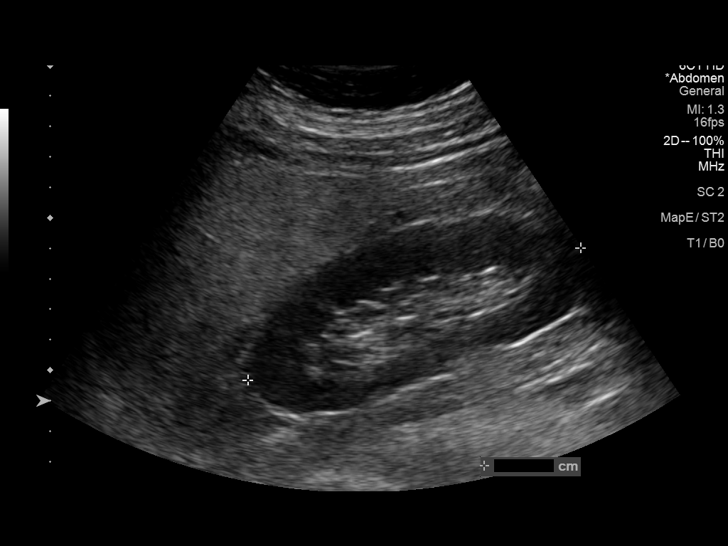
[im 58/78]
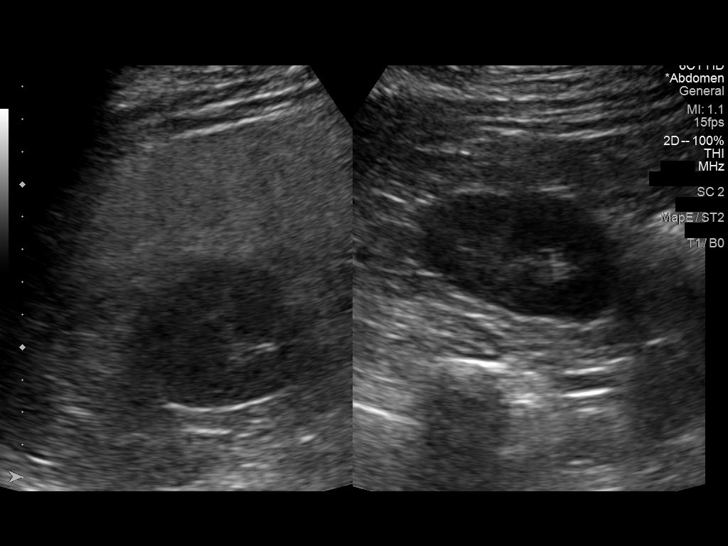
[im 65/78]
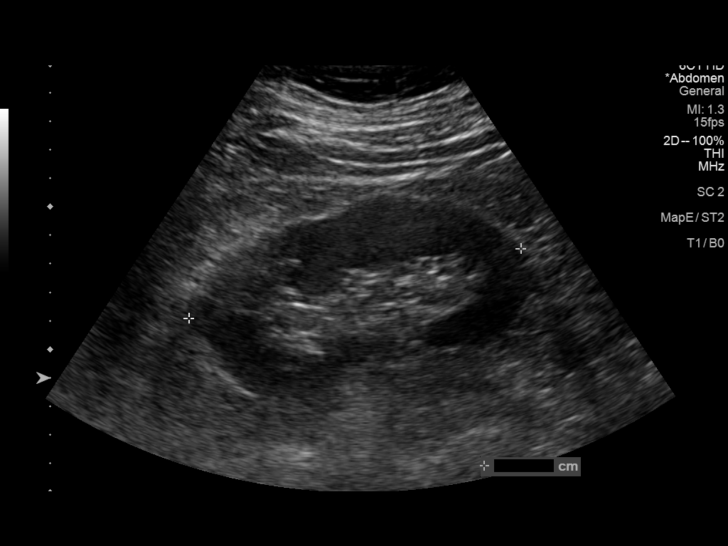
[im 71/78]
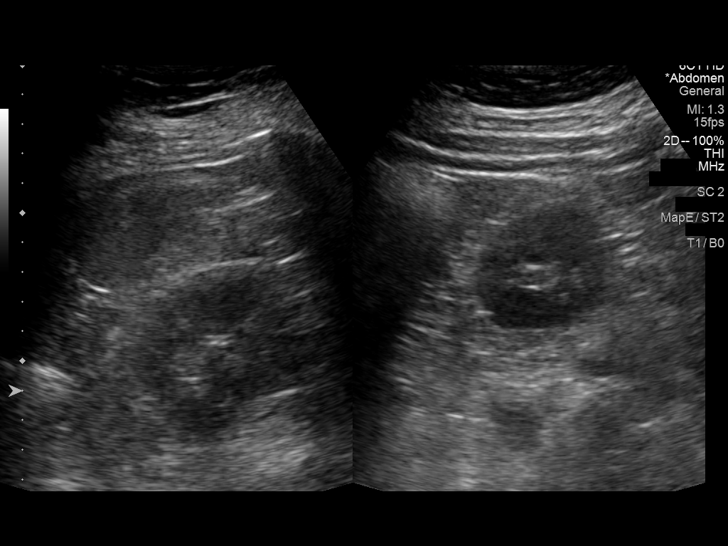
[im 78/78]
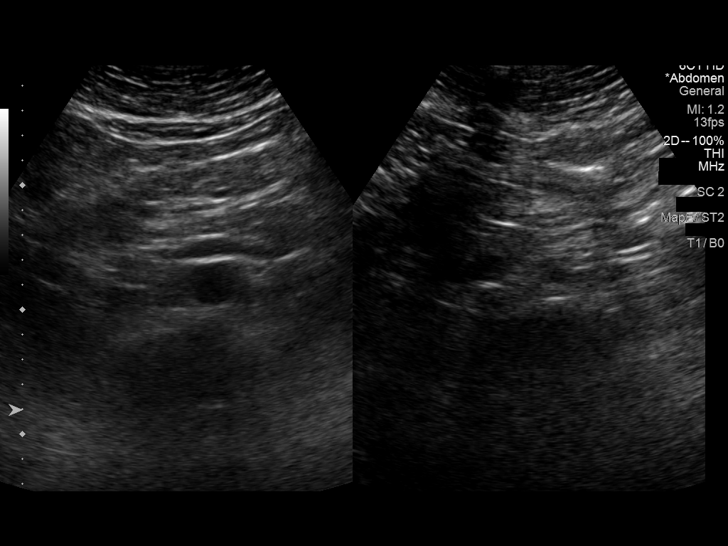

[14 of 25 positions shown; findings below may reference images not displayed]

FINDINGS: Gallbladder: No gallstones or wall thickening visualized. No
sonographic Murphy sign noted by sonographer.

Common bile duct: Diameter: 4.3 mm

Liver: Increased parenchymal echogenicity reflecting up attic
steatosis. Portal vein is patent on color Doppler imaging with
normal direction of blood flow towards the liver.

IVC: No abnormality visualized.

Pancreas: Visualized portion unremarkable.

Spleen: Size and appearance within normal limits.

Right Kidney: Length: 11.8 cm. Echogenicity within normal limits. No
mass or hydronephrosis visualized.

Left Kidney: Length: 11.9 cm. Echogenicity within normal limits. No
mass or hydronephrosis visualized.

Abdominal aorta: No aneurysm visualized.

Other findings: None.
IMPRESSION: 1. Echogenic liver compatible with hepatic steatosis.

## 2021-12-27 ENCOUNTER — Ambulatory Visit (AMBULATORY_SURGERY_CENTER): Payer: Self-pay | Admitting: *Deleted

## 2021-12-27 VITALS — Ht 71.0 in | Wt 274.0 lb

## 2021-12-27 DIAGNOSIS — Z1211 Encounter for screening for malignant neoplasm of colon: Secondary | ICD-10-CM

## 2021-12-27 MED ORDER — NA SULFATE-K SULFATE-MG SULF 17.5-3.13-1.6 GM/177ML PO SOLN: 1.0000 | Freq: Once | ORAL | 0 refills | Status: AC

## 2021-12-27 NOTE — Progress Notes (Signed)
No egg or soy allergy known to patient  No issues known to pt with past sedation with any surgeries or procedures Patient denies ever being told they had issues or difficulty with intubation  No FH of Malignant Hyperthermia Pt is not on diet pills Pt is not on  home 02  Pt is not on blood thinners  Pt denies issues with constipation  No A fib or A flutter Have any cardiac testing pending---yes,heart monitor,echo Pt instructed to use Singlecare.com or GoodRx for a price reduction on prep    Patient's chart reviewed by Osvaldo Angst CNRA prior to previsit and patient appropriate for the [facility].  Previsit completed and red dot placed by patient's name on their procedure day (on provider's schedule).

## 2022-01-05 ENCOUNTER — Encounter: Payer: Self-pay | Admitting: Certified Registered Nurse Anesthetist

## 2022-01-07 ENCOUNTER — Encounter: Payer: Self-pay | Admitting: Gastroenterology

## 2022-01-13 ENCOUNTER — Ambulatory Visit (AMBULATORY_SURGERY_CENTER): Payer: 59 | Admitting: Gastroenterology

## 2022-01-13 ENCOUNTER — Encounter: Payer: Self-pay | Admitting: Gastroenterology

## 2022-01-13 VITALS — BP 122/81 | HR 91 | Temp 98.3°F | Resp 15 | Ht 71.0 in | Wt 274.0 lb

## 2022-01-13 DIAGNOSIS — D122 Benign neoplasm of ascending colon: Secondary | ICD-10-CM

## 2022-01-13 DIAGNOSIS — D128 Benign neoplasm of rectum: Secondary | ICD-10-CM | POA: Diagnosis not present

## 2022-01-13 DIAGNOSIS — D124 Benign neoplasm of descending colon: Secondary | ICD-10-CM

## 2022-01-13 DIAGNOSIS — D129 Benign neoplasm of anus and anal canal: Secondary | ICD-10-CM | POA: Diagnosis not present

## 2022-01-13 DIAGNOSIS — K640 First degree hemorrhoids: Secondary | ICD-10-CM

## 2022-01-13 DIAGNOSIS — Z1211 Encounter for screening for malignant neoplasm of colon: Secondary | ICD-10-CM | POA: Diagnosis present

## 2022-01-13 MED ORDER — SODIUM CHLORIDE 0.9 % IV SOLN: 500.0000 mL | Freq: Once | INTRAVENOUS | Status: DC

## 2022-01-13 NOTE — Progress Notes (Signed)
1458 HR > 100 with esmolol 25 mg given IV, MD updated, vss

## 2022-01-13 NOTE — Progress Notes (Signed)
1515 HR > 100 with esmolol 25 mg given IV, MD updated, vss

## 2022-01-13 NOTE — Progress Notes (Signed)
Pt's states no medical or surgical changes since previsit or office visit. VS assessed by D.T 

## 2022-01-13 NOTE — Progress Notes (Signed)
1551 HR 110 to 116 in PACU, lopressor 1 mg IV given per Dt Cirigliano request. Vss  Pt reported he was suppose to be wearing 30 day monitor from cardiology but did  report this to anesthesia or MD.

## 2022-01-13 NOTE — Progress Notes (Signed)
1510 HR > 100 with esmolol 25 mg given IV, MD updated, vss 

## 2022-01-13 NOTE — Patient Instructions (Addendum)
Handout on polyps given to patient. Await pathology results. Resume previous diet and continue present medications. Repeat colonoscopy for surveillance will be determined based off of pathology results. Depending on pathology results, will discuss the options of either short interval repeat colonoscopy at [facility] for further EMR vs referral for surgical resection  Return to GI office at appointment to be scheduled (please call office to schedule follow-up appointment)  YOU HAD AN ENDOSCOPIC PROCEDURE TODAY AT [place]:   Refer to the procedure report that was given to you for any specific questions about what was found during the examination.  If the procedure report does not answer your questions, please call your gastroenterologist to clarify.  If you requested that your care partner not be given the details of your procedure findings, then the procedure report has been included in a sealed envelope for you to review at your convenience later.  YOU SHOULD EXPECT: Some feelings of bloating in the abdomen. Passage of more gas than usual.  Walking can help get rid of the air that was put into your GI tract during the procedure and reduce the bloating. If you had a lower endoscopy (such as a colonoscopy or flexible sigmoidoscopy) you may notice spotting of blood in your stool or on the toilet paper. If you underwent a bowel prep for your procedure, you may not have a normal bowel movement for a few days.  Please Note:  You might notice some irritation and congestion in your nose or some drainage.  This is from the oxygen used during your procedure.  There is no need for concern and it should clear up in a day or so.  SYMPTOMS TO REPORT IMMEDIATELY:  Following lower endoscopy (colonoscopy or flexible sigmoidoscopy):  Excessive amounts of blood in the stool  Significant tenderness or worsening of abdominal pains  Swelling of the abdomen that is new,  acute  Fever of 100F or higher  For urgent or emergent issues, a gastroenterologist can be reached at any hour by calling [phone]. Do not use MyChart messaging for urgent concerns.    DIET:  We do recommend a small meal at first, but then you may proceed to your regular diet.  Drink plenty of fluids but you should avoid alcoholic beverages for 24 hours.  ACTIVITY:  You should plan to take it easy for the rest of today and you should NOT DRIVE or use heavy machinery until tomorrow (because of the sedation medicines used during the test).    FOLLOW UP: Our staff will call the number listed on your records the next business day following your procedure.  We will call around 7:15- 8:00 am to check on you and address any questions or concerns that you may have regarding the information given to you following your procedure. If we do not reach you, we will leave a message.     If any biopsies were taken you will be contacted by phone or by letter within the next 1-3 weeks.  Please call us at [phone] if you have not heard about the biopsies in 3 weeks.    SIGNATURES/CONFIDENTIALITY: You and/or your care partner have signed paperwork which will be entered into your electronic medical record.  These signatures attest to the fact that that the information above on your After Visit Summary has been reviewed and is understood.  Full responsibility of the confidentiality of this discharge information lies with you and/or your care-partner.

## 2022-01-13 NOTE — Progress Notes (Signed)
GASTROENTEROLOGY PROCEDURE H&P NOTE   Primary Care Physician: Libby Maw, MD    Reason for Procedure:  Colon Cancer screening  Plan:    Colonoscopy  Patient is appropriate for endoscopic procedure(s) in the ambulatory ([facility]) setting.  The nature of the procedure, as well as the risks, benefits, and alternatives were carefully and thoroughly reviewed with the patient. Ample time for discussion and questions allowed. The patient understood, was satisfied, and agreed to proceed.     HPI: Jordan Smith is a 46 y.o. male who presents for colonoscopy for routine Colon Cancer screening.  No active GI symptoms.  No known family history of colon cancer or related malignancy.  Patient is otherwise without complaints or active issues today.  Past Medical History:  Diagnosis Date   Asthma    post covid   Hyperlipidemia    Hypertension    Sleep apnea    c pap    Past Surgical History:  Procedure Laterality Date   HERNIA REPAIR Bilateral     Prior to Admission medications   Medication Sig Start Date End Date Taking? Authorizing Provider  anastrozole (ARIMIDEX) 1 MG tablet  06/10/21  Yes [provider]  tadalafil (CIALIS) 10 MG tablet Take by mouth. 12/05/21   [provider]  Testosterone Cypionate 200 MG/ML KIT  05/20/21   [provider]    Current Outpatient Medications  Medication Sig Dispense Refill   anastrozole (ARIMIDEX) 1 MG tablet      tadalafil (CIALIS) 10 MG tablet Take by mouth.     Testosterone Cypionate 200 MG/ML KIT      Current Facility-Administered Medications  Medication Dose Route Frequency Provider Last Rate Last Admin   0.9 %  sodium chloride infusion  500 mL Intravenous Once Tomara Youngberg V, DO        Allergies as of 01/13/2022 - Review Complete 01/13/2022  Allergen Reaction Noted   Meperidine Other (See Comments) 04/21/2017    Family History  Problem Relation Age of Onset   Aneurysm Mother     Hypothyroidism Mother    Vitamin D deficiency Sister    Cancer Maternal Aunt    Cancer Maternal Uncle    Glaucoma Maternal Grandfather    Colon cancer Neg Hx    Colon polyps Neg Hx    Crohn's disease Neg Hx    Esophageal cancer Neg Hx    Rectal cancer Neg Hx    Stomach cancer Neg Hx    Ulcerative colitis Neg Hx     Social History   Socioeconomic History   Marital status: Married    Spouse name: Not on file   Number of children: Not on file   Years of education: Not on file   Highest education level: Not on file  Occupational History   Not on file  Tobacco Use   Smoking status: Former    Passive exposure: Never   Smokeless tobacco: Former  Scientific laboratory technician Use: Former  Substance and Sexual Activity   Alcohol use: Yes    Alcohol/week: 1.0 - 2.0 standard drink of alcohol    Types: 1 - 2 Standard drinks or equivalent per week    Comment: social   Drug use: Never   Sexual activity: Yes  Other Topics Concern   Not on file  Social History Narrative   Not on file   Social Determinants of Health   Financial Resource Strain: Not on file  Food Insecurity: Not on file  Transportation Needs: Not on file  Physical Activity: Not on file  Stress: Not on file  Social Connections: Not on file  Intimate Partner Violence: Not on file    Physical Exam: Vital signs in last 24 hours: _0  (!) 153/89   Pulse 88   Temp 98.3 F (36.8 C) (Skin)   Ht _1  (1.803 m)   Wt 274 lb (124.3 kg)   SpO2 97%   BMI 38.22 kg/m  GEN: NAD EYE: Sclerae anicteric ENT: MMM CV: Non-tachycardic Pulm: CTA b/l GI: Soft, NT/ND NEURO:  Alert & Oriented x 3   Gerrit Heck, DO [facility]   01/13/2022 2:47 PM

## 2022-01-13 NOTE — Progress Notes (Signed)
Called to room to assist during endoscopic procedure.  Patient ID and intended procedure confirmed with present staff. Received instructions for my participation in the procedure from the performing physician.  

## 2022-01-13 NOTE — Progress Notes (Signed)
1530 HR > 100 with esmolol 25 mg given IV, MD updated, vss

## 2022-01-13 NOTE — Op Note (Signed)
[facility] Patient Name: Jordan Smith Procedure Date: 01/13/2022 2:39 PM MRN: [id number] Endoscopist: Gerrit Heck , MD, [id number] Age: 46 Referring MD:  Date of Birth: 10-28-1975 Gender: Male Account #: [id number] Procedure:                Colonoscopy with polypectomy, clip closure, and                            tattoo placement. Indications:              Screening for colorectal malignant neoplasm, This                            is the patient's first colonoscopy Medicines:                Monitored Anesthesia Care, Metoprolol 1 mg Procedure:                Pre-Anesthesia Assessment:                           - Prior to the procedure, a History and Physical                            was performed, and patient medications and                            allergies were reviewed. The patient's tolerance of                            previous anesthesia was also reviewed. The risks                            and benefits of the procedure and the sedation                            options and risks were discussed with the patient.                            All questions were answered, and informed consent                            was obtained. Prior Anticoagulants: The patient has                            taken no anticoagulant or antiplatelet agents. ASA                            Grade Assessment: II - A patient with mild systemic                            disease. After reviewing the risks and benefits,                            the patient was deemed in satisfactory condition to  undergo the procedure.                           After obtaining informed consent, the colonoscope                            was passed under direct vision. Throughout the                            procedure, the patient's blood pressure, pulse, and                            oxygen saturations were monitored continuously. The                             CF [id number] #[id number] was introduced through the anus                            and advanced to the the terminal ileum. The                            colonoscopy was performed without difficulty. The                            patient tolerated the procedure well. The quality                            of the bowel preparation was good. The terminal                            ileum, ileocecal valve, appendiceal orifice, and                            rectum were photographed. Scope In: 2:55:31 PM Scope Out: 3:41:40 PM Scope Withdrawal Time: 0 hours 44 minutes 46 seconds  Total Procedure Duration: 0 hours 46 minutes 9 seconds  Findings:                 The perianal and digital rectal examinations were                            normal.                           A 25 mm polyp was found in the ascending colon. The                            polyp was sessile. The polyp was incompletely                            removed in piecemeal fashion with a cold snare.                            After significant debulking and polyp resection,  there appeared to be 2 areas which were                            incompletely resected and firm, with increased risk                            for submucosal tethering. This was not further                            manipulated at this time. The resected tissue was                            retrieved. To close a defect after mucosal                            resection, two hemostatic clips were successfully                            placed (MR conditional). There was no bleeding at                            the end of the procedure. Area 1-2 cm proximal and                            2-3 cm distal to the polypectomy site was tattooed                            with an injection of 4 mL of Spot (carbon black).                           A 20 mm polyp was found in the ascending colon.                            This was located  immediately distal to the distal                            tattoo site. The polyp was semi-pedunculated. The                            polyp was removed with a hot snare. Resection and                            retrieval were complete. To close a defect after                            polypectomy, one hemostatic clip was successfully                            placed (MR conditional). There was no bleeding at                            the end of the procedure.  An 8 mm polyp was found in the descending colon.                            The polyp was sessile. The polyp was removed with a                            cold snare. Resection and retrieval were complete.                            Estimated blood loss was minimal.                           Two sessile polyps were found in the rectum. The                            polyps were 2 to 5 mm in size. These polyps were                            removed with a cold snare. Resection and retrieval                            were complete. Estimated blood loss was minimal.                           Non-bleeding internal hemorrhoids were found during                            retroflexion. The hemorrhoids were small.                           The terminal ileum appeared normal. Complications:            No immediate complications. Estimated Blood Loss:     Estimated blood loss was minimal. Impression:               - One 25 mm polyp in the ascending colon, removed                            with a cold snare. Incomplete resection. Resected                            tissue retrieved. Clips (MR conditional) were                            placed. Tattooed.                           - One 20 mm polyp in the ascending colon, removed                            with a hot snare. Resected and retrieved. Clip (MR                            conditional) was placed.                           -  One 8 mm polyp in the  descending colon, removed                            with a cold snare. Resected and retrieved.                           - Two 2 to 5 mm polyps in the rectum, removed with                            a cold snare. Resected and retrieved.                           - Non-bleeding internal hemorrhoids.                           - The examined portion of the ileum was normal. Recommendation:           - Patient has a contact number available for                            emergencies. The signs and symptoms of potential                            delayed complications were discussed with the                            patient. Return to normal activities tomorrow.                            Written discharge instructions were provided to the                            patient.                           - Resume previous diet.                           - Continue present medications.                           - Await pathology results.                           - Repeat colonoscopy for surveillance based on                            pathology results.                           - Depending on pathology results, will discuss the                            options of either short interval repeat colonoscopy  at [facility] for further EMR vs                            referral for surgical resection.                           - Return to GI office at appointment to be                            scheduled. Gerrit Heck, MD 01/13/2022 3:54:14 PM

## 2022-01-13 NOTE — Progress Notes (Signed)
Report given to PACU, vss 

## 2022-01-14 ENCOUNTER — Telehealth: Payer: Self-pay | Admitting: *Deleted

## 2022-01-14 NOTE — Telephone Encounter (Signed)
Attempted f/u phone call. No answer. Left message. °

## 2022-01-17 ENCOUNTER — Telehealth: Payer: Self-pay

## 2022-01-17 NOTE — Telephone Encounter (Signed)
Pt scheduled for office visit on 03/04/21 at 2:20 pm as requested on 01/13/22 colonoscopy report. Pt verbalized understanding.

## 2022-01-24 ENCOUNTER — Encounter: Payer: Self-pay | Admitting: Gastroenterology

## 2022-01-27 ENCOUNTER — Telehealth: Payer: Self-pay | Admitting: Gastroenterology

## 2022-01-27 NOTE — Telephone Encounter (Signed)
Patient returned your call, please advise. 

## 2022-02-27 ENCOUNTER — Emergency Department (HOSPITAL_BASED_OUTPATIENT_CLINIC_OR_DEPARTMENT_OTHER)
Admission: EM | Admit: 2022-02-27 | Discharge: 2022-02-27 | Disposition: A | Discharge status: Home / Self Care | Payer: 59 | Attending: Emergency Medicine | Admitting: Emergency Medicine

## 2022-02-27 ENCOUNTER — Other Ambulatory Visit: Payer: Self-pay

## 2022-02-27 ENCOUNTER — Emergency Department (HOSPITAL_BASED_OUTPATIENT_CLINIC_OR_DEPARTMENT_OTHER): Payer: 59

## 2022-02-27 ENCOUNTER — Encounter (HOSPITAL_BASED_OUTPATIENT_CLINIC_OR_DEPARTMENT_OTHER): Payer: Self-pay | Admitting: Emergency Medicine

## 2022-02-27 DIAGNOSIS — R110 Nausea: Secondary | ICD-10-CM | POA: Diagnosis not present

## 2022-02-27 DIAGNOSIS — Z1152 Encounter for screening for COVID-19: Secondary | ICD-10-CM | POA: Diagnosis not present

## 2022-02-27 DIAGNOSIS — R7401 Elevation of levels of liver transaminase levels: Secondary | ICD-10-CM | POA: Diagnosis not present

## 2022-02-27 DIAGNOSIS — R519 Headache, unspecified: Secondary | ICD-10-CM | POA: Insufficient documentation

## 2022-02-27 LAB — COMPREHENSIVE METABOLIC PANEL
ALT: 66 U/L — ABNORMAL HIGH (ref 0–44)
AST: 128 U/L — ABNORMAL HIGH (ref 15–41)
Albumin: 4.5 g/dL (ref 3.5–5.0)
Alkaline Phosphatase: 51 U/L (ref 38–126)
Anion gap: 15 (ref 5–15)
BUN: 6 mg/dL (ref 6–20)
CO2: 24 mmol/L (ref 22–32)
Calcium: 9.1 mg/dL (ref 8.9–10.3)
Chloride: 97 mmol/L — ABNORMAL LOW (ref 98–111)
Creatinine, Ser: 0.97 mg/dL (ref 0.61–1.24)
GFR, Estimated: >60 mL/min (ref >60)
Glucose, Bld: 95 mg/dL (ref 70–99)
Potassium: 4.1 mmol/L (ref 3.5–5.1)
Sodium: 136 mmol/L (ref 135–145)
Total Bilirubin: 1.4 mg/dL — ABNORMAL HIGH (ref 0.3–1.2)
Total Protein: 7.1 g/dL (ref 6.5–8.1)

## 2022-02-27 LAB — CBC WITH DIFFERENTIAL/PLATELET
Abs Immature Granulocytes: 0.03 10*3/uL (ref 0.00–0.07)
Basophils Absolute: 0 10*3/uL (ref 0.0–0.1)
Basophils Relative: 1 %
Eosinophils Absolute: 0.1 10*3/uL (ref 0.0–0.5)
Eosinophils Relative: 1 %
HCT: 48.1 % (ref 39.0–52.0)
Hemoglobin: 16.9 g/dL (ref 13.0–17.0)
Immature Granulocytes: 1 %
Lymphocytes Relative: 17 %
Lymphs Abs: 1 10*3/uL (ref 0.7–4.0)
MCH: 35.8 pg — ABNORMAL HIGH (ref 26.0–34.0)
MCHC: 35.1 g/dL (ref 30.0–36.0)
MCV: 101.9 fL — ABNORMAL HIGH (ref 80.0–100.0)
Monocytes Absolute: 0.5 10*3/uL (ref 0.1–1.0)
Monocytes Relative: 9 %
Neutro Abs: 4.2 10*3/uL (ref 1.7–7.7)
Neutrophils Relative %: 71 %
Platelets: 225 10*3/uL (ref 150–400)
RBC: 4.72 MIL/uL (ref 4.22–5.81)
RDW: 13.2 % (ref 11.5–15.5)
WBC: 5.9 10*3/uL (ref 4.0–10.5)
nRBC: 0 % (ref 0.0–0.2)

## 2022-02-27 LAB — RESP PANEL BY RT-PCR (RSV, FLU A&B, COVID)  RVPGX2
Influenza A by PCR: NEGATIVE
Influenza B by PCR: NEGATIVE
Resp Syncytial Virus by PCR: NEGATIVE
SARS Coronavirus 2 by RT PCR: NEGATIVE

## 2022-02-27 MED ORDER — SODIUM CHLORIDE 0.9 % IV BOLUS
1000.0000 mL | Freq: Once | INTRAVENOUS | Status: AC
  Administered 2022-02-27: 1000 mL via INTRAVENOUS

## 2022-02-27 MED ORDER — METOCLOPRAMIDE HCL 5 MG/ML IJ SOLN
10.0000 mg | Freq: Once | INTRAMUSCULAR | Status: AC
  Administered 2022-02-27: 10 mg via INTRAVENOUS
  Filled 2022-02-27: qty 2

## 2022-02-27 MED ORDER — ACETAMINOPHEN 500 MG PO TABS
1000.0000 mg | ORAL_TABLET | Freq: Once | ORAL | Status: DC
  Filled 2022-02-27: qty 2

## 2022-02-27 MED ORDER — DIPHENHYDRAMINE HCL 50 MG/ML IJ SOLN
12.5000 mg | Freq: Once | INTRAMUSCULAR | Status: AC
  Administered 2022-02-27: 12.5 mg via INTRAVENOUS
  Filled 2022-02-27: qty 1

## 2022-02-27 MED ORDER — KETOROLAC TROMETHAMINE 15 MG/ML IJ SOLN
15.0000 mg | Freq: Once | INTRAMUSCULAR | Status: AC
  Administered 2022-02-27: 15 mg via INTRAVENOUS
  Filled 2022-02-27: qty 1

## 2022-02-27 NOTE — ED Provider Notes (Signed)
[facility] EMERGENCY DEPT Provider Note   CSN: [id number] Arrival date & time: 02/27/22  0800     History  Chief Complaint  Patient presents with   Headache    Jordan Smith is a 47 y.o. male with PMH significant for elevated hemoglobin, hypogonadism, and OSA presenting with headache.  Patient reports right occipital headache which started 4 days ago.  Headache has been constant despite taking ibuprofen at home. He also notes mild left arm tingling since yesterday.  This morning woke up with right eye twitching and continued tingling in his left arm, which concerned him.  He denies weakness, speech difficulty, or facial droop.  Endorses mild nausea, but no vomiting.  No photophobia or vision changes.  No fever, chills, cough, congestion, chest pain, or abdominal pain. Denies recent trauma or head injury. Positive sick contacts--wife has COVID although she has been quarantining away from him.  Patient mainly concerned because his mom had a brain aneurysm.     Home Medications Prior to Admission medications   Medication Sig Start Date End Date Taking? Authorizing Provider  anastrozole (ARIMIDEX) 1 MG tablet  06/10/21   [provider]  tadalafil (CIALIS) 10 MG tablet Take by mouth. 12/05/21   [provider]  Testosterone Cypionate 200 MG/ML KIT  05/20/21   [provider]      Allergies    Meperidine    Review of Systems   Review of Systems  Constitutional:  Negative for chills and fever.  HENT:  Negative for congestion and rhinorrhea.   Eyes:  Negative for photophobia and visual disturbance.       Right eye "twitching"  Respiratory:  Negative for cough and shortness of breath.   Cardiovascular:  Negative for chest pain.  Gastrointestinal:  Positive for nausea. Negative for abdominal pain, diarrhea and vomiting.  Neurological:  Positive for headaches. Negative for dizziness, facial asymmetry, speech difficulty and weakness.    Physical  Exam Updated Vital Signs BP (!) 145/98 (BP Location: Right Arm)   Pulse 97   Temp 98.5 F (36.9 C) (Oral)   Resp 13   Ht 6' (1.829 m)   Wt 117.9 kg   SpO2 96%   BMI 35.26 kg/m  Physical Exam Constitutional:      General: He is not in acute distress.    Appearance: He is well-developed.  HENT:     Head: Normocephalic and atraumatic.     Mouth/Throat:     Mouth: Mucous membranes are moist.     Pharynx: Oropharynx is clear.  Eyes:     Extraocular Movements: Extraocular movements intact.     Pupils: Pupils are equal, round, and reactive to light.  Cardiovascular:     Rate and Rhythm: Normal rate and regular rhythm.     Heart sounds: Normal heart sounds.  Pulmonary:     Effort: Pulmonary effort is normal.     Breath sounds: Normal breath sounds.  Abdominal:     Palpations: Abdomen is soft.     Tenderness: There is no abdominal tenderness.  Musculoskeletal:     Cervical back: Neck supple. No rigidity.  Lymphadenopathy:     Cervical: No cervical adenopathy.  Skin:    General: Skin is warm and dry.  Neurological:     Mental Status: He is alert. Mental status is at baseline.     Cranial Nerves: No cranial nerve deficit, dysarthria or facial asymmetry.     Sensory: No sensory deficit.     Motor:  No weakness.  Psychiatric:        Speech: Speech normal.     ED Results / Procedures / Treatments   Labs (all labs ordered are listed, but only abnormal results are displayed) Labs Reviewed  RESP PANEL BY RT-PCR (RSV, FLU A&B, COVID)  [id number]  COMPREHENSIVE METABOLIC PANEL  CBC WITH DIFFERENTIAL/PLATELET    EKG EKG Interpretation  Date/Time:  Thursday February 27 2022 08:06:25 EST Ventricular Rate:  98 PR Interval:  147 QRS Duration: 92 QT Interval:  336 QTC Calculation: 429 R Axis:   22 Text Interpretation: Sinus rhythm Probable left atrial enlargement Baseline wander in lead(s) V4 V5 Confirmed by Campbell Stall ([id number]) on [id number] 8:25:43 AM  Radiology No results  found.  Procedures Procedures    Medications Ordered in ED Medications  sodium chloride 0.9 % bolus 1,000 mL (has no administration in time range)    ED Course/ Medical Decision Making/ A&P                           Medical Decision Making Amount and/or Complexity of Data Reviewed Labs: ordered. Radiology: ordered.  Risk Prescription drug management.   This is a 47 year old male with PMH significant for OSA, hypogonadism on testosterone injections, and elevated hemoglobin who presents with 4 days of right occipital headache. He reports left arm tingling but otherwise has no red flags on history. VSS and physical exam including neuro exam is unremarkable.  Differential includes but is not limited to tension headache, migraine, dehydration, viral illness including COVID, TIA. Lower suspicion for CVA given normal neuro exam. His polycythemia could put him at higher risk of thrombosis, however he had therapeutic phlebotomy 2 days ago and again no focal deficits on exam. Doubt intracranial bleed given absence of trauma and no evidence of elevated ICP.   Will obtain basic labs including CBC with diff, CMP, and viral quad screen as well as CT head. 1L fluid bolus ordered.  CT head with no acute findings and blood work unremarkable aside from mildly elevated LFTs which is chronic. Upon reassessment, patient still with headache- will give headache cocktail with Toradol, Reglan, Benadryl.  Patient feeling better, relieved to hear his CT head was normal.  Stable for discharge home with PCP follow-up.  ED return precautions reviewed.  Final Clinical Impression(s) / ED Diagnoses Final diagnoses:  None    Rx / DC Orders ED Discharge Orders     None         Alcus Dad, MD [id number]    Campbell Stall P, DO [id number]

## 2022-02-27 NOTE — ED Notes (Signed)
Patient verbalizes understanding of discharge instructions. Opportunity for questioning and answers were provided. Patient discharged from ED.  °

## 2022-02-27 NOTE — Discharge Instructions (Signed)
You were seen today for headache.  You had a normal CT scan of your head.  Your lab work was normal aside from elevated liver enzymes- continue working with your PCP on this.  You can continue taking ibuprofen and Tylenol for your headache.  Be sure to stay well-hydrated and get adequate sleep with your CPAP.

## 2022-02-27 NOTE — ED Triage Notes (Signed)
H/a that started last night  rt back of head and now now left arm in numb and left eye feels droopy drove himself here, slight  trouble focusing rt eye he states

## 2022-03-04 ENCOUNTER — Encounter: Payer: Self-pay | Admitting: Gastroenterology

## 2022-03-04 ENCOUNTER — Ambulatory Visit (INDEPENDENT_AMBULATORY_CARE_PROVIDER_SITE_OTHER): Payer: 59 | Admitting: Gastroenterology

## 2022-03-04 VITALS — BP 130/90 | HR 120 | Ht 72.0 in | Wt 263.0 lb

## 2022-03-04 DIAGNOSIS — R748 Abnormal levels of other serum enzymes: Secondary | ICD-10-CM | POA: Diagnosis not present

## 2022-03-04 DIAGNOSIS — K760 Fatty (change of) liver, not elsewhere classified: Secondary | ICD-10-CM

## 2022-03-04 DIAGNOSIS — Z86010 Personal history of colonic polyps: Secondary | ICD-10-CM | POA: Diagnosis not present

## 2022-03-04 NOTE — Progress Notes (Signed)
Chief Complaint:    Hx of colon polyps, procedure follow-up   HPI:     Patient is a 47 y.o. male with a history of OSA, HTN, HLD, presenting to the Gastroenterology Clinic for postprocedure follow-up and to discuss results of his recent colonoscopy in person.  - 01/13/2022: Colonoscopy for initial CRC screening: 25 mm polyp in the ascending colon incompletely removed in piecemeal fashion with cold snare.  There were 2 areas that were incompletely resected and firm with increased suspicion for submucosal tethering.  Lesion was closed with 2 hemostatic clips then the area 1-2 cm proximal and 2-3 cm distal were tattooed with spot.  Another 20 mm polyp in the ascending colon located immediately distal to the distal tattoo site which was semipedunculated and removed completely with hot snare and site closed with 1 hemostatic clip.  8 mm descending polyp removed with cold snare, 2 rectal polyps 2-5 mm removed with cold snare.  Small internal hemorrhoids.  Normal TI.  All polyps were adenomas without high-grade dysplasia.  He returns today to discuss options of repeat colonoscopy at [facility] for additional attempt at advanced EMR vs referral for surgical resection.  Otherwise, no GI symptoms or active issues today.  Separately, recent labs notable for elevated liver enzymes as below, which were similarly elevated in 03/2019 and previously diagnosed with fatty liver.  Otherwise normal PLT and renal function.  History of polycythemia on recent labs at [facility] to include H/H 18/52 and MCV 105, and follows with Hematology at [facility] with recent phlebotomy earlier this month.  - 04/05/2019: RUQ [place]: Hepatic steatosis without duct dilatation.  Normal GB  Has intentionally lost 45# with diet/exercise in last 6+ months.  Has 2-3 drinks per week in total.     Latest Ref Rng & Units 02/27/2022    8:32 AM 03/22/2019   10:03 AM  Hepatic Function  Total Protein 6.5 - 8.1 g/dL 7.1  7.0   Albumin 3.5 - 5.0 g/dL 4.5   4.6   AST 15 - 41 U/L 128  99   ALT 0 - 44 U/L 66  136   Alk Phosphatase 38 - 126 U/L 51  52   Total Bilirubin 0.3 - 1.2 mg/dL 1.4  0.7       Latest Ref Rng & Units 02/27/2022    8:32 AM 03/22/2019   10:03 AM  CBC  WBC 4.0 - 10.5 K/uL 5.9  4.1   Hemoglobin 13.0 - 17.0 g/dL 16.9  15.8   Hematocrit 39.0 - 52.0 % 48.1  45.9   Platelets 150 - 400 K/uL 225  199.0       Review of systems:     No chest pain, no SOB, no fevers, no urinary sx   Past Medical History:  Diagnosis Date   Asthma    post covid   Hyperlipidemia    Hypertension    Sleep apnea    c pap    Patient's surgical history, family medical history, social history, medications and allergies were all reviewed in Epic    Current Outpatient Medications  Medication Sig Dispense Refill   anastrozole (ARIMIDEX) 1 MG tablet      tadalafil (CIALIS) 10 MG tablet Take by mouth.     Testosterone Cypionate 200 MG/ML KIT      triamcinolone ointment (KENALOG) 0.5 % Apply topically 2 (two) times daily.     No current facility-administered medications for this visit.    Physical Exam:  BP (!) 130/90   Pulse (!) 120   Ht 6' (1.829 m)   Wt 263 lb (119.3 kg)   SpO2 98%   BMI 35.67 kg/m   GENERAL:  Pleasant male in NAD PSYCH: : Cooperative, normal affect Musculoskeletal:  Normal muscle tone, normal strength NEURO: Alert and oriented x 3, no focal neurologic deficits   IMPRESSION and PLAN:    1) History of colon polyps Index colonoscopy in 12/2021 for routine CRC screening with multiple adenomatous polyps, to include a very large 25 mm polyp in the ascending colon that was removed incompletely via piecemeal fashion.  There were 2 small islands of remnant adenomatous tissue which were somewhat firm and appeared to have a degree of submucosal tethering which did not allow for complete resection.  The area proximal and distal to the polypectomy site was tattooed.  Path with adenoma without high-grade dysplasia.  We  discussed the colonoscopy findings and histology results at length today.  Discussed options for complete resection to include 1) repeat colonoscopy with EMR vs 2) right hemicolectomy.  He strongly prefers the latter.  We discussed the risks, benefits of repeat colonoscopy with EMR at length today, to include risk of incomplete resection, perforation, immediate or delayed bleeding, etc. I will discuss his case with Dr. Rush Landmark regarding advanced EMR techniques at [facility] and update the patient with results of that conversation.  2) Hepatic steatosis 3) Elevated liver enzymes History of elevated liver enzymes since at least 2021 with hepatic steatosis noted on ultrasound at that time.  He has made recent significant dietary/lifestyle modifications with intentional 45# weight loss through healthy eating and regular exercise.  We discussed pathophysiology of fatty liver.  Discussed with patient and he prefers to wait for further testing. - Repeat liver enzymes in 6-12 months.  If still elevated despite excellent lifestyle modifications, discussed the role/utility of extended serologic evaluation along with advanced imaging to include ultrasound elastography - Change from ALT>AST to now AST>ALT could be indicative of conversion to steatohepatitis.  Will reevaluate on repeat serologic panel - Recent labs with mildly elevated T. bili as well.  Otherwise preserved hepatic synthetic function on available labs.  Repeat T. bili will be evaluated with repeat labs as above - Check PT/INR with next set of labs  I spent 35 minutes of time, including in depth chart review, independent review of results as outlined above, communicating results with the patient directly, face-to-face time with the patient, coordinating care, and ordering studies and medications as appropriate, and documentation.           Lavena Bullion ,DO, FACG 03/04/2022, 2:31 PM

## 2022-03-04 NOTE — Patient Instructions (Signed)
Follow up as needed.  If you are age 49 or older, your body mass index should be between 23-30. Your Body mass index is 35.67 kg/m. If this is out of the aforementioned range listed, please consider follow up with your Primary Care Provider.  If you are age 61 or younger, your body mass index should be between 19-25. Your Body mass index is 35.67 kg/m. If this is out of the aformentioned range listed, please consider follow up with your Primary Care Provider.   __________________________________________________________  The [facility] providers would like to encourage you to use [facility] to communicate with providers for non-urgent requests or questions.  Due to long hold times on the telephone, sending your provider a message by [facility] may be a faster and more efficient way to get a response.  Please allow 48 business hours for a response.  Please remember that this is for non-urgent requests.   Due to recent changes in healthcare laws, you may see the results of your imaging and laboratory studies on MyChart before your provider has had a chance to review them.  We understand that in some cases there may be results that are confusing or concerning to you. Not all laboratory results come back in the same time frame and the provider may be waiting for multiple results in order to interpret others.  Please give [place] 48 hours in order for your provider to thoroughly review all the results before contacting the office for clarification of your results.    Thank you for choosing me and [facility].  Vito Cirigliano, D.O.

## 2022-03-06 ENCOUNTER — Other Ambulatory Visit: Payer: Self-pay

## 2022-03-06 ENCOUNTER — Telehealth: Payer: Self-pay

## 2022-03-06 DIAGNOSIS — Z86010 Personal history of colonic polyps: Secondary | ICD-10-CM

## 2022-03-06 MED ORDER — NA SULFATE-K SULFATE-MG SULF 17.5-3.13-1.6 GM/177ML PO SOLN: 1.0000 | Freq: Once | ORAL | 0 refills | Status: AC

## 2022-03-06 NOTE — Telephone Encounter (Signed)
Colon EMR scheduled, pt instructed and medications reviewed.  Patient instructions mailed to home.  Patient to call with any questions or concerns.

## 2022-03-06 NOTE — Telephone Encounter (Signed)
-----  Message from Irving Copas., MD sent at 03/06/2022 12:47 PM EST ----- Chong Sicilian, Please move forward with scheduling this patient a colonoscopy with EMR 90-minute slot for March. Patient can be scheduled for his clinic visit. If he wants to wait until after the clinic visit to schedule that is up to him. Please update Dr. Bryan Lemma and I when he has been scheduled. Thanks. GM ----- Message ----- From: Lavena Bullion, DO Sent: 03/06/2022  10:38 AM EST To: Irving Copas., MD  Thank you!   Yes, when I spoke with him this week, he would very much like to move forward with repeat colonoscopy with you for attempt at resection.   I very much appreciate your help!!  VC  ----- Message ----- From: Irving Copas., MD Sent: 03/06/2022  10:08 AM EST To: Lavena Bullion, DO  VC, I am happy to give an attempt at removing any persisting tissue prior to him needing to undergo a hemicolectomy. If he is willing to move forward with this we can talk with him and get him scheduled. Let me know and then I can forward this to Kaytee Taliercio. Thanks. GM ----- Message ----- From: Lavena Bullion, DO Sent: 03/04/2022   3:02 PM EST To: Irving Copas., MD  Can I get your opinion on this patient?  47 yo male with initial CRC screening colonoscopy on 01/13/2022 with a 25 mm polyp in the ascending colon incompletely removed in piecemeal fashion with cold snare.  There were 2 small islands that were incompletely resected and appeared to be somewhat firm with increased suspicion for submucosal tethering.  Lesion was closed with 2 hemostatic clips then the area 1-2 cm proximal and 2-3 cm distal were tattooed with spot.  Path returned as an adenoma without HGB.   He had a few other adenomas that were removed as well.   I just spoke with him in the office and he would very much like to avoid right hemicolectomy if possible. I think there is a chance that this can be completely  resected at the hospital, but concede that you would likely have a better chance at success than me, so figured I would see if you are game for it and have a spot in your schedule somewhere in the next few months.   Thanks for your consideration and review.   [employer]

## 2022-03-06 NOTE — Telephone Encounter (Signed)
The pt has been scheduled for colon EMR on 3/21 at 830 am at [facility] with GM   Left message on machine to call back

## 2022-04-30 ENCOUNTER — Encounter (HOSPITAL_COMMUNITY): Payer: Self-pay | Admitting: Gastroenterology

## 2022-05-07 NOTE — Anesthesia Preprocedure Evaluation (Signed)
Anesthesia Evaluation  Patient identified by MRN, date of birth, ID band Patient awake    Reviewed: Allergy & Precautions, NPO status , Patient's Chart, lab work & pertinent test results, reviewed documented beta blocker date and time   History of Anesthesia Complications Negative for: history of anesthetic complications  Airway Mallampati: II  TM Distance: >3 FB Neck ROM: Full    Dental  (+) Dental Advisory Given, Teeth Intact   Pulmonary asthma , sleep apnea and Continuous Positive Airway Pressure Ventilation , former smoker   Pulmonary exam normal        Cardiovascular hypertension, Pt. on home beta blockers and Pt. on medications Normal cardiovascular exam     Neuro/Psych negative neurological ROS  negative psych ROS   GI/Hepatic negative GI ROS, Neg liver ROS,,,  Endo/Other   Obesity   Renal/GU negative Renal ROS     Musculoskeletal negative musculoskeletal ROS (+)    Abdominal   Peds  Hematology  Polycythemia    Anesthesia Other Findings   Reproductive/Obstetrics                             Anesthesia Physical Anesthesia Plan  ASA: 2  Anesthesia Plan: MAC   Post-op Pain Management: Minimal or no pain anticipated   Induction:   PONV Risk Score and Plan: 1 and Propofol infusion and Treatment may vary due to age or medical condition  Airway Management Planned: Nasal Cannula and Natural Airway  Additional Equipment: None  Intra-op Plan:   Post-operative Plan:   Informed Consent: I have reviewed the patients History and Physical, chart, labs and discussed the procedure including the risks, benefits and alternatives for the proposed anesthesia with the patient or authorized representative who has indicated his/her understanding and acceptance.       Plan Discussed with: CRNA and Anesthesiologist  Anesthesia Plan Comments:        Anesthesia Quick Evaluation

## 2022-05-08 ENCOUNTER — Other Ambulatory Visit: Payer: Self-pay

## 2022-05-08 ENCOUNTER — Encounter (HOSPITAL_COMMUNITY)
Admission: RE | Disposition: A | Discharge status: Home / Self Care | Payer: Self-pay | Source: Ambulatory Visit | Attending: Gastroenterology

## 2022-05-08 ENCOUNTER — Encounter (HOSPITAL_COMMUNITY): Payer: Self-pay | Admitting: Gastroenterology

## 2022-05-08 ENCOUNTER — Ambulatory Visit (HOSPITAL_COMMUNITY)
Admission: RE | Admit: 2022-05-08 | Discharge: 2022-05-08 | Disposition: A | Discharge status: Home / Self Care | Payer: 59 | Source: Ambulatory Visit | Attending: Gastroenterology | Admitting: Gastroenterology

## 2022-05-08 ENCOUNTER — Ambulatory Visit (HOSPITAL_BASED_OUTPATIENT_CLINIC_OR_DEPARTMENT_OTHER): Payer: 59 | Admitting: Anesthesiology

## 2022-05-08 ENCOUNTER — Ambulatory Visit (HOSPITAL_COMMUNITY): Payer: 59 | Admitting: Anesthesiology

## 2022-05-08 DIAGNOSIS — Z9889 Other specified postprocedural states: Secondary | ICD-10-CM | POA: Diagnosis not present

## 2022-05-08 DIAGNOSIS — K641 Second degree hemorrhoids: Secondary | ICD-10-CM | POA: Diagnosis not present

## 2022-05-08 DIAGNOSIS — Z87891 Personal history of nicotine dependence: Secondary | ICD-10-CM | POA: Insufficient documentation

## 2022-05-08 DIAGNOSIS — G4730 Sleep apnea, unspecified: Secondary | ICD-10-CM | POA: Diagnosis not present

## 2022-05-08 DIAGNOSIS — Z9989 Dependence on other enabling machines and devices: Secondary | ICD-10-CM | POA: Diagnosis not present

## 2022-05-08 DIAGNOSIS — Z86010 Personal history of colonic polyps: Secondary | ICD-10-CM | POA: Diagnosis not present

## 2022-05-08 DIAGNOSIS — K621 Rectal polyp: Secondary | ICD-10-CM | POA: Insufficient documentation

## 2022-05-08 DIAGNOSIS — Z6838 Body mass index (BMI) 38.0-38.9, adult: Secondary | ICD-10-CM | POA: Diagnosis not present

## 2022-05-08 DIAGNOSIS — D751 Secondary polycythemia: Secondary | ICD-10-CM | POA: Insufficient documentation

## 2022-05-08 DIAGNOSIS — K635 Polyp of colon: Secondary | ICD-10-CM | POA: Diagnosis present

## 2022-05-08 DIAGNOSIS — I10 Essential (primary) hypertension: Secondary | ICD-10-CM | POA: Insufficient documentation

## 2022-05-08 DIAGNOSIS — D128 Benign neoplasm of rectum: Secondary | ICD-10-CM

## 2022-05-08 DIAGNOSIS — G4733 Obstructive sleep apnea (adult) (pediatric): Secondary | ICD-10-CM | POA: Diagnosis not present

## 2022-05-08 DIAGNOSIS — E669 Obesity, unspecified: Secondary | ICD-10-CM | POA: Diagnosis not present

## 2022-05-08 DIAGNOSIS — Z79899 Other long term (current) drug therapy: Secondary | ICD-10-CM | POA: Diagnosis not present

## 2022-05-08 HISTORY — PX: POLYPECTOMY: SHX5525

## 2022-05-08 HISTORY — PX: BIOPSY: SHX5522

## 2022-05-08 HISTORY — DX: Secondary polycythemia: D75.1

## 2022-05-08 HISTORY — PX: COLONOSCOPY WITH PROPOFOL: SHX5780

## 2022-05-08 HISTORY — PX: ENDOSCOPIC MUCOSAL RESECTION: SHX6839

## 2022-05-08 SURGERY — COLONOSCOPY WITH PROPOFOL
Anesthesia: Monitor Anesthesia Care

## 2022-05-08 MED ORDER — PROPOFOL 500 MG/50ML IV EMUL
INTRAVENOUS | Status: DC | PRN
  Administered 2022-05-08: 120 ug/kg/min via INTRAVENOUS

## 2022-05-08 MED ORDER — DEXMEDETOMIDINE HCL IN NACL 80 MCG/20ML IV SOLN
INTRAVENOUS | Status: DC | PRN
  Administered 2022-05-08: 8 ug via BUCCAL

## 2022-05-08 MED ORDER — LIDOCAINE HCL 1 % IJ SOLN
INTRAMUSCULAR | Status: DC | PRN
  Administered 2022-05-08: 50 mg via INTRADERMAL

## 2022-05-08 MED ORDER — SODIUM CHLORIDE 0.9 % IV SOLN: INTRAVENOUS | Status: DC

## 2022-05-08 MED ORDER — PROPOFOL 500 MG/50ML IV EMUL
INTRAVENOUS | Status: AC
  Filled 2022-05-08: qty 50

## 2022-05-08 MED ORDER — LACTATED RINGERS IV SOLN: INTRAVENOUS | Status: DC | PRN

## 2022-05-08 MED ORDER — LACTATED RINGERS IV SOLN: Freq: Once | INTRAVENOUS | Status: AC

## 2022-05-08 MED ORDER — PROPOFOL 10 MG/ML IV BOLUS
INTRAVENOUS | Status: DC | PRN
  Administered 2022-05-08 (×8): 20 mg via INTRAVENOUS

## 2022-05-08 SURGICAL SUPPLY — 22 items

## 2022-05-08 NOTE — Transfer of Care (Signed)
Immediate Anesthesia Transfer of Care Note  Patient: Jordan Smith  Procedure(s) Performed: COLONOSCOPY WITH PROPOFOL ENDOSCOPIC MUCOSAL RESECTION BIOPSY POLYPECTOMY  Patient Location: PACU and Endoscopy Unit  Anesthesia Type:MAC  Level of Consciousness: awake, alert , and oriented  Airway & Oxygen Therapy: Patient Spontanous Breathing and Patient connected to face mask oxygen  Post-op Assessment: Report given to RN and Post -op Vital signs reviewed and stable  Post vital signs: Reviewed and stable  Last Vitals:  Vitals Value Taken Time  BP    Temp    Pulse    Resp    SpO2      Last Pain:  Vitals:   05/08/22 0752  TempSrc: Temporal  PainSc: 0-No pain         Complications: No notable events documented.

## 2022-05-08 NOTE — Discharge Instructions (Signed)

## 2022-05-08 NOTE — Op Note (Signed)
[facility] Patient Name: Jordan Smith Procedure Date: 05/08/2022 MRN: [id number] Attending MD: Justice Britain , MD, [id number] Date of Birth: 1976/01/17 CSN: [id number] Age: 47 Admit Type: Outpatient Procedure:                Colonoscopy Indications:              Excision of colonic polyp Providers:                Justice Britain, MD, Jeanella Cara, RN,                            Gloris Ham, Technician Referring MD:             Gerrit Heck, MD, [facility] Medicines:                Monitored Anesthesia Care Complications:            No immediate complications. Estimated Blood Loss:     Estimated blood loss was minimal. Procedure:                Pre-Anesthesia Assessment:                           - Prior to the procedure, a History and Physical                            was performed, and patient medications and                            allergies were reviewed. The patient's tolerance of                            previous anesthesia was also reviewed. The risks                            and benefits of the procedure and the sedation                            options and risks were discussed with the patient.                            All questions were answered, and informed consent                            was obtained. Prior Anticoagulants: The patient has                            taken no anticoagulant or antiplatelet agents                            except for NSAID medication. ASA Grade Assessment:                            II - A patient with mild systemic disease. After  reviewing the risks and benefits, the patient was                            deemed in satisfactory condition to undergo the                            procedure.                           After obtaining informed consent, the colonoscope                            was passed under direct vision. Throughout the                             procedure, the patient's blood pressure, pulse, and                            oxygen saturations were monitored continuously. The                            [id number] Olympus colonoscope was                            introduced through the anus and advanced to the the                            cecum, identified by appendiceal orifice and                            ileocecal valve. The colonoscopy was performed                            without difficulty. The patient tolerated the                            procedure. The quality of the bowel preparation was                            adequate. The ileocecal valve, appendiceal orifice,                            and rectum were photographed. Scope In: 10:12:53 AM Scope Out: 10:34:21 AM Scope Withdrawal Time: 0 hours 19 minutes 12 seconds  Total Procedure Duration: 0 hours 21 minutes 28 seconds  Findings:      The digital rectal exam findings include hemorrhoids. Pertinent       negatives include no palpable rectal lesions.      A medium post polypectomy scar was found in the mid ascending colon.       This was found between 2 previously placed tattoos. There was some very       minor residual polypoid appearing tissue within the central portion of       the scar. This was removed with a cold snare for and subsequent forcep  avulsion.      Two sessile polyps were found in the rectum. The polyps were 2 to 3 mm       in size. These polyps were removed with a cold snare. Resection and       retrieval were complete.      Normal mucosa was found in the entire colon otherwise.      Non-bleeding non-thrombosed internal hemorrhoids were found during       retroflexion, during perianal exam and during digital exam. The       hemorrhoids were Grade II (internal hemorrhoids that prolapse but reduce       spontaneously). Impression:               - Hemorrhoids found on digital rectal exam.                            - Post-polypectomy scar in the mid ascending colon                            between 2 previously placed tattoos. The scar looks                            relatively benign with just a very minor polypoid                            appearing area in the central portion of the scar.                            Tissue removed via snare and forcep avulsion.                           - Two 2 to 3 mm polyps in the rectum, removed with                            a cold snare. Resected and retrieved.                           - Normal mucosa in the entire examined colon                            otherwise.                           - Non-bleeding non-thrombosed internal hemorrhoids. Moderate Sedation:      Not Applicable - Patient had care per Anesthesia. Recommendation:           - The patient will be observed post-procedure,                            until all discharge criteria are met.                           - Discharge patient to home.                           - Patient has a  contact number available for                            emergencies. The signs and symptoms of potential                            delayed complications were discussed with the                            patient. Return to normal activities tomorrow.                            Written discharge instructions were provided to the                            patient.                           - High fiber diet.                           - Use FiberCon 1-2 tablets PO daily.                           - Continue present medications.                           - Await pathology results.                           - Repeat colonoscopy for surveillance based on                            pathology results. I have a suspicion that the                            pathology may not end up showing any adenomatous                            tissue, and if that is the case that is good news.                            I think in  any case based on the findings I would                            likely recommend a 3-year follow-up colonoscopy but                            will wait for the final pathology.                           - The findings and recommendations were discussed                            with the patient.                           -  The findings and recommendations were discussed                            with the patient's family. Procedure Code(s):        --- Professional ---                           [phone], Colonoscopy, flexible; with removal of                            tumor(s), polyp(s), or other lesion(s) by snare                            technique Diagnosis Code(s):        --- Professional ---                           [id number], Other specified postprocedural states                           D12.8, Benign neoplasm of rectum                           K64.1, Second degree hemorrhoids                           K63.5, Polyp of colon CPT copyright 2022 American Medical Association. All rights reserved. The codes documented in this report are preliminary and upon coder review may  be revised to meet current compliance requirements. Justice Britain, MD 05/08/2022 10:58:53 AM Number of Addenda: 0

## 2022-05-08 NOTE — H&P (Signed)
GASTROENTEROLOGY PROCEDURE H&P NOTE   Primary Care Physician: Medicine, [facility]  HPI: Jordan Smith is a 47 y.o. male who presents for Colonoscopy for attempt at followup of AC polyp that was previously removed and felt to be incomplete for advanced resection abilities.  Past Medical History:  Diagnosis Date   Hyperlipidemia    Hypertension    Polycythemia    gives blood evey 2 weeks   Sleep apnea    c pap   Past Surgical History:  Procedure Laterality Date   CARPAL TUNNEL RELEASE Right 2010   HERNIA REPAIR Bilateral    No current facility-administered medications for this encounter.   No current facility-administered medications for this encounter. Allergies  Allergen Reactions   Meperidine Anaphylaxis    Other Reaction(s): Other (See Comments) Happened as a child, pt does not remember reaction.     Family History  Problem Relation Age of Onset   Aneurysm Mother    Hypothyroidism Mother    Vitamin D deficiency Sister    Cancer Maternal Aunt    Cancer Maternal Uncle    Glaucoma Maternal Grandfather    Colon cancer Neg Hx    Colon polyps Neg Hx    Crohn's disease Neg Hx    Esophageal cancer Neg Hx    Rectal cancer Neg Hx    Stomach cancer Neg Hx    Ulcerative colitis Neg Hx    Social History   Socioeconomic History   Marital status: Married    Spouse name: Not on file   Number of children: Not on file   Years of education: Not on file   Highest education level: Not on file  Occupational History   Not on file  Tobacco Use   Smoking status: Former    Packs/day: 1.00    Years: 20.00    Additional pack years: 0.00    Total pack years: 20.00    Types: Cigarettes    Quit date: 2014    Years since quitting: 10.2    Passive exposure: Never   Smokeless tobacco: Former  Scientific laboratory technician Use: Former  Substance and Sexual Activity   Alcohol use: Yes    Alcohol/week: 1.0 - 2.0 standard drink of alcohol    Types: 1 - 2 Standard  drinks or equivalent per week    Comment: social   Drug use: Never   Sexual activity: Yes  Other Topics Concern   Not on file  Social History Narrative   Not on file   Social Determinants of Health   Financial Resource Strain: Not on file  Food Insecurity: Not on file  Transportation Needs: Not on file  Physical Activity: Not on file  Stress: Not on file  Social Connections: Not on file  Intimate Partner Violence: Not on file    Physical Exam: Today's Vitals   04/30/22 1339 04/30/22 1400  Weight: 117 kg 128.4 kg  Height: 6' (1.829 m) 6' (1.829 m)   Body mass index is 38.38 kg/m. GEN: NAD EYE: Sclerae anicteric ENT: MMM CV: Non-tachycardic GI: Soft, NT/ND NEURO:  Alert & Oriented x 3  Lab Results: No results for input(s): "WBC", "HGB", "HCT", "PLT" in the last 72 hours. BMET No results for input(s): "NA", "K", "CL", "CO2", "GLUCOSE", "BUN", "CREATININE", "CALCIUM" in the last 72 hours. LFT No results for input(s): "PROT", "ALBUMIN", "AST", "ALT", "ALKPHOS", "BILITOT", "BILIDIR", "IBILI" in the last 72 hours. PT/INR No results for input(s): "LABPROT", "INR" in the last  72 hours.   Impression / Plan: This is a 47 y.o.male who presents for Colonoscopy for attempt at followup of AC polyp that was previously removed and felt to be incomplete for advanced resection abilities.  The risks and benefits of endoscopic evaluation/treatment were discussed with the patient and/or family; these include but are not limited to the risk of perforation, infection, bleeding, missed lesions, lack of diagnosis, severe illness requiring hospitalization, as well as anesthesia and sedation related illnesses.  The patient's history has been reviewed, patient examined, no change in status, and deemed stable for procedure.  The patient and/or family is agreeable to proceed.   Based upon the description and endoscopic pictures I do feel that it is reasonable to pursue an Advanced Polypectomy  attempt of the polyp/lesion.  We discussed some of the techniques of advanced polypectomy which include Endoscopic Mucosal Resection, OVESCO Full-Thickness Resection, Endorotor Morcellation, and Tissue Ablation via Fulguration.  We also reviewed images of typical techniques as noted above.  The risks and benefits of endoscopic evaluation were discussed with the patient; these include but are not limited to the risk of perforation, infection, bleeding, missed lesions, lack of diagnosis, severe illness requiring hospitalization, as well as anesthesia and sedation related illnesses.  During attempts at advanced resection, the risks of bleeding and perforation/leak are increased as opposed to diagnostic and screening procedures, and that was discussed with the patient as well.   In addition, I explained that with the possible need for piecemeal resection, subsequent short-interval endoscopic evaluation for follow up and potential retreatment of the lesion/area may be necessary.  I did offer, a referral to surgery in order for patient to have opportunity to discuss surgical management/intervention prior to finalizing decision for attempt at endoscopic removal, however, the patient deferred on this.  If, after attempt at removal of the polyp/lesion, it is found that the patient has a complication or that an invasive lesion or malignant lesion is found, or that the polyp/lesion continues to recur, the patient is aware and understands that surgery may still be indicated/required.  All patient questions were answered, to the best of my ability, and the patient agrees to the aforementioned plan of action with follow-up as indicated.    Justice Britain, MD [facility] Advanced Endoscopy Office # [id number]

## 2022-05-08 NOTE — Anesthesia Postprocedure Evaluation (Signed)
Anesthesia Post Note  Patient: Jordan Smith  Procedure(s) Performed: COLONOSCOPY WITH PROPOFOL ENDOSCOPIC MUCOSAL RESECTION BIOPSY POLYPECTOMY     Patient location during evaluation: PACU Anesthesia Type: MAC Level of consciousness: awake and alert Pain management: pain level controlled Vital Signs Assessment: post-procedure vital signs reviewed and stable Respiratory status: spontaneous breathing, nonlabored ventilation and respiratory function stable Cardiovascular status: stable and blood pressure returned to baseline Anesthetic complications: no   No notable events documented.  Last Vitals:  Vitals:   05/08/22 1051 05/08/22 1100  BP: (!) 148/78 (!) 141/83  Pulse: (!) 102 89  Resp: 17 (!) 22  Temp:    SpO2: 98% 100%    Last Pain:  Vitals:   05/08/22 1051  TempSrc:   PainSc: 0-No pain                 Audry Pili

## 2022-05-09 ENCOUNTER — Encounter (HOSPITAL_COMMUNITY): Payer: Self-pay | Admitting: Gastroenterology

## 2022-05-09 LAB — SURGICAL PATHOLOGY

## 2022-05-14 ENCOUNTER — Encounter: Payer: Self-pay | Admitting: Gastroenterology

## 2023-02-03 DIAGNOSIS — L719 Rosacea, unspecified: Secondary | ICD-10-CM | POA: Insufficient documentation

## 2023-02-03 DIAGNOSIS — R002 Palpitations: Secondary | ICD-10-CM | POA: Insufficient documentation

## 2023-02-03 DIAGNOSIS — R7989 Other specified abnormal findings of blood chemistry: Secondary | ICD-10-CM | POA: Insufficient documentation

## 2023-05-01 ENCOUNTER — Ambulatory Visit (INDEPENDENT_AMBULATORY_CARE_PROVIDER_SITE_OTHER): Payer: 59 | Admitting: Gastroenterology

## 2023-05-01 ENCOUNTER — Other Ambulatory Visit (INDEPENDENT_AMBULATORY_CARE_PROVIDER_SITE_OTHER)

## 2023-05-01 ENCOUNTER — Encounter: Payer: Self-pay | Admitting: Gastroenterology

## 2023-05-01 DIAGNOSIS — R6881 Early satiety: Secondary | ICD-10-CM | POA: Diagnosis not present

## 2023-05-01 DIAGNOSIS — R7401 Elevation of levels of liver transaminase levels: Secondary | ICD-10-CM | POA: Diagnosis not present

## 2023-05-01 DIAGNOSIS — K760 Fatty (change of) liver, not elsewhere classified: Secondary | ICD-10-CM | POA: Diagnosis not present

## 2023-05-01 DIAGNOSIS — R110 Nausea: Secondary | ICD-10-CM | POA: Diagnosis not present

## 2023-05-01 LAB — HEPATIC FUNCTION PANEL
ALT: 78 U/L — ABNORMAL HIGH (ref 0–53)
AST: 181 U/L — ABNORMAL HIGH (ref 0–37)
Albumin: 4.9 g/dL (ref 3.5–5.2)
Alkaline Phosphatase: 65 U/L (ref 39–117)
Bilirubin, Direct: 0.4 mg/dL — ABNORMAL HIGH (ref 0.0–0.3)
Total Bilirubin: 1.2 mg/dL (ref 0.2–1.2)
Total Protein: 7.9 g/dL (ref 6.0–8.3)

## 2023-05-01 LAB — PROTIME-INR
INR: 1.1 ratio — ABNORMAL HIGH (ref 0.8–1.0)
Prothrombin Time: 11.7 s (ref 9.6–13.1)

## 2023-05-01 NOTE — Patient Instructions (Signed)
 _______________________________________________________  If your blood pressure at your visit was 140/90 or greater, please contact your primary care physician to follow up on this.  If you are age 50 or younger, your body mass index should be between 19-25. Your Body mass index is 34.45 kg/m. If this is out of the aformentioned range listed, please consider follow up with your Primary Care Provider.  ________________________________________________________  The [facility] providers would like to encourage you to use [facility] to communicate with providers for non-urgent requests or questions.  Due to long hold times on the telephone, sending your provider a message by [facility] may be a faster and more efficient way to get a response.  Please allow 48 business hours for a response.  Please remember that this is for non-urgent requests.  _______________________________________________________  Your provider has requested that you go to the basement level for lab work before leaving today. Press "B" on the elevator. The lab is located at the first door on the left as you exit the elevator.  You have been scheduled for a CT scan of the abdomen and pelvis at [facility], 1st floor Radiology. You are scheduled on 05-08-23 at 5pm You should arrive at 3pm for registration and to drink oral contrast.    Please follow the written instructions below on the day of your exam:   1) Do not eat anything after 1pm (4 hours prior to your test)   You may take any medications as prescribed with a small amount of water, if necessary. If you take any of the following medications: METFORMIN, GLUCOPHAGE, GLUCOVANCE, AVANDAMET, RIOMET, FORTAMET, ACTOPLUS MET, JANUMET, GLUMETZA or METAGLIP, you MAY be asked to HOLD this medication 48 hours AFTER the exam.   The purpose of you drinking the oral contrast is to aid in the visualization of your intestinal tract. The contrast solution may cause some diarrhea. Depending  on your individual set of symptoms, you may also receive an intravenous injection of x-ray contrast/dye. Plan on being at [facility] for 45 minutes or longer, depending on the type of exam you are having performed.   If you have any questions regarding your exam or if you need to reschedule, you may call [facility] at [phone] between the hours of 8:00 am and 5:00 pm, Monday-Friday.   You have been scheduled for an endoscopy. Please follow written instructions given to you at your visit today.  If you use inhalers (even only as needed), please bring them with you on the day of your procedure.  If you take any of the following medications, they will need to be adjusted prior to your procedure:   DO NOT TAKE 7 DAYS PRIOR TO TEST- Trulicity (dulaglutide) Ozempic, Wegovy (semaglutide) Mounjaro (tirzepatide) Bydureon Bcise (exanatide extended release)  DO NOT TAKE 1 DAY PRIOR TO YOUR TEST Rybelsus (semaglutide) Adlyxin (lixisenatide) Victoza (liraglutide) Byetta (exanatide) ___________________________________________________________________________  Due to recent changes in healthcare laws, you may see the results of your imaging and laboratory studies on MyChart before your provider has had a chance to review them.  We understand that in some cases there may be results that are confusing or concerning to you. Not all laboratory results come back in the same time frame and the provider may be waiting for multiple results in order to interpret others.  Please give [place] 48 hours in order for your provider to thoroughly review all the results before contacting the office for clarification of your results.   It was  a pleasure to see you today!  Vito Cirigliano, D.O.

## 2023-05-01 NOTE — Progress Notes (Signed)
 Chief Complaint: Elevated liver enzymes, nausea   GI history: 48 year old male with a history of OSA (on CPAP), HTN, HLD, polycythemia follows in the GI clinic for the following:  1) History of colon polyps. Index colonoscopy in 12/2021 for routine CRC screening with multiple adenomatous polyps, to include a very large 25 mm polyp in the ascending colon that was removed incompletely via piecemeal fashion. There were 2 small islands of remnant adenomatous tissue which were somewhat firm and appeared to have a degree of submucosal tethering which did not allow for complete resection. The area proximal and distal to the polypectomy site was tattooed. Path with adenoma without high-grade dysplasia.  Ultimately, patient opted for repeat colonoscopy with EMR, completed in 04/2022 and no residual adenomatous tissue.  Plan for 3-year repeat  2) Elevated liver enzymes.  History of elevated AST/ALT since 03/2019.  Previously diagnosed with fatty liver by imaging, and he intentionally lost 45# w/ diet/exercise in 2023/24.  Does have a history of polycythemia and follows with Hematology at [facility] requiring prior phlebotomy.  - 03/22/2019: AST/ALT 99/136, normal ALP, T. Bili - 04/05/2019: RUQ [place]: Hepatic steatosis without duct dilatation. Normal GB - 11/26/2021: AST/ALT 111/84, T. bili 1.5, normal ALP - 02/27/2022: AST/ALT 128/66, T. bili 1.4, normal ALP - 03/04/2022: Follow-up in the GI clinic.  Suspicion for conversion to steatohepatitis given change in AST/ALT ratio.  Plan for close lab monitoring, aggressive management of underlying comorbidities, and continued diet/exercise   Endoscopic History: - 01/13/2022: Colonoscopy for initial CRC screening: 25 mm polyp in the ascending colon incompletely removed in piecemeal fashion with cold snare.  There were 2 areas that were incompletely resected and firm with increased suspicion for submucosal tethering.  Lesion was closed with 2 hemostatic clips then  the area 1-2 cm proximal and 2-3 cm distal were tattooed with spot.  Another 20 mm polyp in the ascending colon located immediately distal to the distal tattoo site which was semipedunculated and removed completely with hot snare and site closed with 1 hemostatic clip.  8 mm descending polyp removed with cold snare, 2 rectal polyps 2-5 mm removed with cold snare.  Small internal hemorrhoids.  Normal TI.  All polyps were adenomas without high-grade dysplasia.  - 05/08/2022: Colonoscopy (Dr. Meridee Score): Minor residual polypoid tissue within the central portion of postpolypectomy scar in the mid ascending colon removed with cold snare and forceps avulsion (path: Benign colonic mucosa without dysplasia).  2 small rectal hyperplastic polyps, grade 2 internal hemorrhoids.  Recommended repeat in 3 years   HPI:     Jordan Smith is a 48 y.o. male presenting to the Gastroenterology Clinic for routine follow-up.  Was last seen in the office on 03/04/2022, presents for follow-up today due to history of elevated liver enzymes as outlined above.  Reviewed labs from [facility] in 12/2022: - AST/ALT 138/58, T. bili 0.7, ALP 75.  Remainder of CMP essentially normal - Normal WBC  No recent abdominal or hepatic imaging for review.  Has been following with Hematology at [facility] with regular phlebotomies for polycythemia presumed 2/2 testosterone supplement.  Separately, has been having nausea most mornings.  No emesis, but does experience dry heaves. Nausea lasts till about lunchtime. Has been present for several months now. Also with early satiety now, generally finishing 1/2 of what he typically eats. Had previously lost 50# intentionally, but weight now stable. Started metoprolol prior to sxs onset (nausea <1%). Weaning  testosterone therapy due to polycythemia, but GI sxs predate the wean.   No heartburn, regurgitation, dysphagia.   His wife was recently diagnosed with complex Crohn's disease, currently being  managed at [facility].   Past Medical History:  Diagnosis Date   Hyperlipidemia    Hypertension    Polycythemia    gives blood evey 2 weeks   Sleep apnea    c pap     Past Surgical History:  Procedure Laterality Date   BIOPSY  05/08/2022   Procedure: BIOPSY;  Surgeon: Lemar Lofty., MD;  Location: [facility];  Service: Gastroenterology;;   Fidela Salisbury RELEASE Right 2010   COLONOSCOPY WITH PROPOFOL N/A 05/08/2022   Procedure: COLONOSCOPY WITH PROPOFOL;  Surgeon: Lemar Lofty., MD;  Location: [facility];  Service: Gastroenterology;  Laterality: N/A;   ENDOSCOPIC MUCOSAL RESECTION N/A 05/08/2022   Procedure: ENDOSCOPIC MUCOSAL RESECTION;  Surgeon: Meridee Score Netty Starring., MD;  Location: [facility];  Service: Gastroenterology;  Laterality: N/A;   HERNIA REPAIR Bilateral    POLYPECTOMY  05/08/2022   Procedure: POLYPECTOMY;  Surgeon: Mansouraty, Netty Starring., MD;  Location: [facility];  Service: Gastroenterology;;   Family History  Problem Relation Age of Onset   Aneurysm Mother    Hypothyroidism Mother    Vitamin D deficiency Sister    Cancer Maternal Aunt    Cancer Maternal Uncle    Glaucoma Maternal Grandfather    Colon cancer Neg Hx    Colon polyps Neg Hx    Crohn's disease Neg Hx    Esophageal cancer Neg Hx    Rectal cancer Neg Hx    Stomach cancer Neg Hx    Ulcerative colitis Neg Hx    Social History   Tobacco Use   Smoking status: Former    Current packs/day: 0.00    Average packs/day: 1 pack/day for 20.0 years (20.0 ttl pk-yrs)    Types: Cigarettes    Start date: 77    Quit date: 2014    Years since quitting: 11.2    Passive exposure: Never   Smokeless tobacco: Former  Building services engineer status: Former  Substance Use Topics   Alcohol use: Yes    Alcohol/week: 1.0 - 2.0 standard drink of alcohol    Types: 1 - 2 Standard drinks or equivalent per week    Comment: social   Drug use: Never   Current Outpatient Medications   Medication Sig Dispense Refill   anastrozole (ARIMIDEX) 1 MG tablet Take 1 mg by mouth every Tuesday.     metoprolol tartrate (LOPRESSOR) 25 MG tablet Take 12.5 mg by mouth 2 (two) times daily with a meal.     tadalafil (CIALIS) 10 MG tablet Take 10 mg by mouth daily as needed for erectile dysfunction.     meloxicam (MOBIC) 15 MG tablet Take 15 mg by mouth daily. (Patient not taking: Reported on 05/01/2023)     Testosterone Cypionate 200 MG/ML KIT Inject 0.5 mLs into the skin 2 (two) times a week. (Patient not taking: Reported on 05/01/2023)     triamcinolone ointment (KENALOG) 0.5 % Apply 1 Application topically at bedtime. Eye lid (Patient not taking: Reported on 05/01/2023)     No current facility-administered medications for this visit.   Allergies  Allergen Reactions   Meperidine Anaphylaxis    Other Reaction(s): Other (See Comments) Happened as a child, pt does not remember reaction.       Review of Systems: All systems reviewed and negative except where  noted in HPI.     Physical Exam:    Wt Readings from Last 3 Encounters:  05/01/23 254 lb (115.2 kg)  05/08/22 260 lb (117.9 kg)  03/04/22 263 lb (119.3 kg)    BP 130/80   Pulse 87   Ht 6' (1.829 m)   Wt 254 lb (115.2 kg)   BMI 34.45 kg/m  Constitutional:  Pleasant, in no acute distress. Psychiatric: Normal mood and affect. Behavior is normal. Cardiovascular: Normal rate, regular rhythm. No edema Pulmonary/chest: Effort normal and breath sounds normal. No wheezing, rales or rhonchi. Abdominal: Soft, nondistended, nontender. Bowel sounds active throughout. There are no masses palpable. No hepatomegaly. Neurological: Alert and oriented to person place and time. Skin: Skin is warm and dry. No rashes noted.   ASSESSMENT AND PLAN;   1) Nausea 2) Early satiety  - EGD to evaluate for medical/luminal pathology - CT abdomen/pelvis to evaluate for extraluminal pathology - Try to maintain caloric intake by using Ensure,  boost, etc. as meal replacement  3) Elevated liver enzymes Can see an increase in bilirubin with testosterone supplement, otherwise elevated liver enzymes is typically limited to methyltestosterone use, which he is not on.  Previous imaging otherwise shows hepatic steatosis.  He had intentionally lost 45# after that diagnosis.  Weight stable now. -As previously discussed, there is some suspicion for conversion from hepatic steatosis to steatohepatitis given change in AST/ALT ratio.  Enzymes largely stable, but still elevated despite appropriate lifestyle/dietary modification. - Extended hepatic serologic workup to evaluate for concomitant liver disease - Plan for repeat imaging.  Pending labs, will decide repeat ultrasound vs elastography.  Will otherwise evaluate liver at time of CT as above  The indications, risks, and benefits of EGD were explained to the patient in detail. Risks include but are not limited to bleeding, perforation, adverse reaction to medications, and cardiopulmonary compromise. Sequelae include but are not limited to the possibility of surgery, hospitalization, and mortality. The patient verbalized understanding and wished to proceed. All questions answered, referred to scheduler. Further recommendations pending results of the exam.      Shellia Cleverly, DO, FACG  05/01/2023, 8:42 AM   Medicine, [facility]*

## 2023-05-05 LAB — HEPATITIS A ANTIBODY, TOTAL: Hepatitis A AB,Total: NONREACTIVE

## 2023-05-05 LAB — HEPATITIS C ANTIBODY: Hepatitis C Ab: NONREACTIVE

## 2023-05-05 LAB — ANTI-SMOOTH MUSCLE ANTIBODY, IGG: Actin (Smooth Muscle) Antibody (IGG): <20 U (ref <20)

## 2023-05-05 LAB — TISSUE TRANSGLUTAMINASE, IGA: (tTG) Ab, IgA: <1 U/mL

## 2023-05-05 LAB — ANA: Anti Nuclear Antibody (ANA): NEGATIVE

## 2023-05-05 LAB — HEPATITIS B SURFACE ANTIBODY,QUALITATIVE: Hep B S Ab: NONREACTIVE

## 2023-05-05 LAB — IGM: IgM, Serum: 95 mg/dL (ref 50–300)

## 2023-05-05 LAB — MITOCHONDRIAL ANTIBODIES: Mitochondrial M2 Ab, IgG: <=20 U (ref <=20.0)

## 2023-05-05 LAB — CERULOPLASMIN: Ceruloplasmin: 27 mg/dL (ref 14–30)

## 2023-05-05 LAB — HEPATITIS B SURFACE ANTIGEN: Hepatitis B Surface Ag: NONREACTIVE

## 2023-05-05 LAB — IGG: IgG (Immunoglobin G), Serum: 1060 mg/dL (ref 600–1640)

## 2023-05-05 LAB — IGA: Immunoglobulin A: 279 mg/dL (ref 47–310)

## 2023-05-05 LAB — ALPHA-1-ANTITRYPSIN: A-1 Antitrypsin, Ser: 184 mg/dL (ref 83–199)

## 2023-05-08 ENCOUNTER — Ambulatory Visit (HOSPITAL_COMMUNITY)
Admission: RE | Admit: 2023-05-08 | Discharge: 2023-05-08 | Disposition: A | Discharge status: Home / Self Care | Source: Ambulatory Visit | Attending: Gastroenterology | Admitting: Gastroenterology

## 2023-05-08 DIAGNOSIS — R6881 Early satiety: Secondary | ICD-10-CM | POA: Diagnosis present

## 2023-05-08 DIAGNOSIS — R110 Nausea: Secondary | ICD-10-CM | POA: Insufficient documentation

## 2023-05-08 MED ORDER — IOHEXOL 9 MG/ML PO SOLN: 500.0000 mL | ORAL | Status: DC

## 2023-05-08 MED ORDER — IOHEXOL 300 MG/ML  SOLN
100.0000 mL | Freq: Once | INTRAMUSCULAR | Status: AC | PRN
  Administered 2023-05-08: 100 mL via INTRAVENOUS

## 2023-05-13 ENCOUNTER — Other Ambulatory Visit: Payer: Self-pay

## 2023-05-13 DIAGNOSIS — R110 Nausea: Secondary | ICD-10-CM

## 2023-05-13 DIAGNOSIS — R6881 Early satiety: Secondary | ICD-10-CM

## 2023-05-13 DIAGNOSIS — R7401 Elevation of levels of liver transaminase levels: Secondary | ICD-10-CM

## 2023-05-17 NOTE — Progress Notes (Unsigned)
 Cardiology Office Note:   Date:  05/22/2023  ID:  Jordan Smith, DOB December 05, 1975, MRN [id number] PCP:  Julien Girt, PA-C  [facility] Providers Cardiologist:  Alverda Skeans, MD Referring MD: Medicine, [facility]*  Chief Complaint/Reason for Referral: Palpitations ASSESSMENT:    1. Palpitations   2. BMI 34.0-34.9,adult     PLAN:   In order of problems listed above: Palpitations: Will stop metoprolol and place monitor to evaluate further.  Check reflex TSH, CMP, CBC, and echocardiogram.  The patient had only 2 episodes of SVT on the monitor done last year with 13 beat and 3 beat runs which were not associated with patient triggers.  Is feeling of a high heart rate seem to be associated with the initiation of testosterone therapy.  He has been off testosterone for about 4 weeks yet the symptoms persist.  Perhaps with more time away from testosterone these will improve. Elevated BMI: Check hemoglobin A1c to evaluate further.            Dispo:  Return in about 6 months (around 11/21/2023).      Medication Adjustments/Labs and Tests Ordered: Current medicines are reviewed at length with the patient today.  Concerns regarding medicines are outlined above.  The following changes have been made:     Labs/tests ordered: Orders Placed This Encounter  Procedures   Comprehensive metabolic panel with GFR   CBC   TSH Rfx on Abnormal to Free T4   Hemoglobin A1c   LONG TERM MONITOR (3-14 DAYS)   EKG 12-Lead   ECHOCARDIOGRAM COMPLETE    Medication Changes: No orders of the defined types were placed in this encounter.   Current medicines are reviewed at length with the patient today.  The patient does not have concerns regarding medicines.  I spent 33 minutes reviewing all clinical data during and prior to this visit including all relevant imaging studies, laboratories, clinical information from other health systems and prior notes from both Cardiology and other  specialties, interviewing the patient, conducting a complete physical examination, and coordinating care in order to formulate a comprehensive and personalized evaluation and treatment plan.   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 The patient is a 48 year old male with the above listed medical problems referred for recommendations regarding palpitations.  The patient was seen by his primary care provider recently with complaints of palpitations.  It does not look like an EKG was performed at that visit.  He was started on metoprolol and referred to cardiology for further recommendations.  On review of the patient's records he did have a monitor placed in January 2024 which demonstrated rare SVT and PVCs with no significant other findings.  An echocardiogram in 2023 demonstrated an ejection fraction of 55% with no significant valvular abnormalities with borderline hypertrophy.  He experiences persistent palpitations characterized by a rapid heartbeat, described as feeling like his heart is 'trying to jump out of my chest.' These episodes have been occurring for approximately a year to a year and a half. He was initially evaluated at a hospital emergency room due to concerns of a stroke or heart attack. A history of rare supraventricular tachycardia (SVT) was identified on a heart monitor last year. He was informed that his heart was generally healthy, with some  thickening in one of the side walls, but nothing significant.  He has been on metoprolol, 12.5 mg twice daily, but it does not control his symptoms throughout the day. His heart rate increases significantly by the evening, despite taking the medication in the morning and at lunchtime. If he misses a dose, his resting heart rate  exceeds 120 beats per minute. He associates the initiation of metoprolol with the development of rosacea, experiencing redness and blisters on his face and head, for which he has been treated with doxycycline.  He has been off testosterone injections for about four weeks, which he had started using prior to the onset of his symptoms. He suspects this may have contributed to his symptoms.        Current Medications: Current Meds  Medication Sig   anastrozole (ARIMIDEX) 1 MG tablet Take 1 mg by mouth every Tuesday.   CARAFATE 1 g tablet Take sucralfate tablets 1 gram PO QID for 2 weeks.   pantoprazole (PROTONIX) 40 MG tablet Take 1 tablet (40 mg total) by mouth 2 (two) times daily for 60 days, THEN 1 tablet (40 mg total) daily. Take Protonix (pantoprazole) 40 mg PO BID for 8 weeks, then reduce to 40 mg daily if upper GI.   tadalafil (CIALIS) 10 MG tablet Take 10 mg by mouth daily as needed for erectile dysfunction.   testosterone cypionate (DEPOTESTOSTERONE CYPIONATE) 200 MG/ML injection SMARTSIG:0.5 Milliliter(s) IM Twice a Week   [DISCONTINUED] metoprolol tartrate (LOPRESSOR) 25 MG tablet Take 12.5 mg by mouth 2 (two) times daily with a meal.     Review of Systems:   Please see the history of present illness.    All other systems reviewed and are negative.     EKGs/Labs/Other Test Reviewed:   EKG: EKG from 2024 demonstrates sinus rhythm possible left atrial enlargement  EKG Interpretation Date/Time:  Friday May 22 2023 16:42:29 EDT Ventricular Rate:  68 PR Interval:  164 QRS Duration:  96 QT Interval:  384 QTC Calculation: 408 R Axis:   24  Text Interpretation: Normal sinus rhythm Normal ECG When compared with ECG of 27-Feb-2022 08:06, PREVIOUS ECG IS PRESENT Confirmed by Alverda Skeans ([id number]) on 05/22/2023 4:45:47 PM         Risk Assessment/Calculations:          Physical Exam:   VS:  BP 106/72   Pulse 80   Ht 6' (1.829 m)   Wt 250 lb (113.4 kg)   SpO2 98%   BMI  33.91 kg/m        Wt Readings from Last 3 Encounters:  05/22/23 250 lb (113.4 kg)  05/20/23 254 lb (115.2 kg)  05/01/23 254 lb (115.2 kg)      GENERAL:  No apparent distress, AOx3 HEENT:  No carotid bruits, +2 carotid impulses, no scleral icterus CAR: RRR no murmurs, gallops, rubs, or thrills RES:  Clear to auscultation bilaterally ABD:  Soft, nontender, nondistended, positive bowel sounds x 4 VASC:  +2 radial pulses, +2 carotid pulses NEURO:  CN 2-12 grossly intact; motor and sensory grossly intact PSYCH:  No active depression or anxiety EXT:  No edema, ecchymosis, or cyanosis  Signed, Orbie Pyo, MD  05/22/2023 4:51 PM    [facility] [address] [place], [place], [place]  [address] Phone: [phone]; Fax: [phone]   Note:  This document was prepared using Dragon voice recognition software and may include unintentional dictation errors.

## 2023-05-20 ENCOUNTER — Ambulatory Visit (AMBULATORY_SURGERY_CENTER): Admitting: Gastroenterology

## 2023-05-20 ENCOUNTER — Encounter: Payer: Self-pay | Admitting: Gastroenterology

## 2023-05-20 ENCOUNTER — Other Ambulatory Visit: Payer: Self-pay

## 2023-05-20 VITALS — BP 124/85 | HR 90 | Temp 99.1°F | Resp 14 | Ht 72.0 in | Wt 254.0 lb

## 2023-05-20 DIAGNOSIS — R110 Nausea: Secondary | ICD-10-CM

## 2023-05-20 DIAGNOSIS — K2970 Gastritis, unspecified, without bleeding: Secondary | ICD-10-CM

## 2023-05-20 DIAGNOSIS — K760 Fatty (change of) liver, not elsewhere classified: Secondary | ICD-10-CM

## 2023-05-20 DIAGNOSIS — K3189 Other diseases of stomach and duodenum: Secondary | ICD-10-CM

## 2023-05-20 DIAGNOSIS — R7401 Elevation of levels of liver transaminase levels: Secondary | ICD-10-CM

## 2023-05-20 DIAGNOSIS — K2100 Gastro-esophageal reflux disease with esophagitis, without bleeding: Secondary | ICD-10-CM

## 2023-05-20 DIAGNOSIS — K229 Disease of esophagus, unspecified: Secondary | ICD-10-CM | POA: Diagnosis not present

## 2023-05-20 DIAGNOSIS — K209 Esophagitis, unspecified without bleeding: Secondary | ICD-10-CM

## 2023-05-20 DIAGNOSIS — K319 Disease of stomach and duodenum, unspecified: Secondary | ICD-10-CM | POA: Diagnosis not present

## 2023-05-20 MED ORDER — CARAFATE 1 G PO TABS: ORAL_TABLET | ORAL | 0 refills | Status: DC

## 2023-05-20 MED ORDER — PANTOPRAZOLE SODIUM 40 MG PO TBEC
DELAYED_RELEASE_TABLET | ORAL | 0 refills | Status: DC
Start: 2023-05-20 — End: 2023-07-23

## 2023-05-20 MED ORDER — SODIUM CHLORIDE 0.9 % IV SOLN: 500.0000 mL | Freq: Once | INTRAVENOUS | Status: DC

## 2023-05-20 NOTE — Progress Notes (Signed)
 Drowsy, VSS, resps reg and even. Report to RN

## 2023-05-20 NOTE — Progress Notes (Signed)
 GASTROENTEROLOGY PROCEDURE H&P NOTE   Primary Care Physician: Julien Girt, PA-C    Reason for Procedure:   Nausea, early satiety  Plan:    EGD  Patient is appropriate for endoscopic procedure(s) in the ambulatory ([facility]) setting.  The nature of the procedure, as well as the risks, benefits, and alternatives were carefully and thoroughly reviewed with the patient. Ample time for discussion and questions allowed. The patient understood, was satisfied, and agreed to proceed.     HPI: Jordan Smith is a 48 y.o. male who presents for EGD for evaluation of Nausea and early satiety.  Patient was most recently seen in the Gastroenterology Clinic on 05/01/2023 by me.  CT on 3/21 with GB stones and sludge, but no cholecystitis, severe hepatic steatosis.   No interval change in medical history since that appointment. Please refer to that note for full details regarding GI history and clinical presentation.   Past Medical History:  Diagnosis Date   Hyperlipidemia    Hypertension    Polycythemia    gives blood evey 2 weeks   Sleep apnea    c pap    Past Surgical History:  Procedure Laterality Date   BIOPSY  05/08/2022   Procedure: BIOPSY;  Surgeon: Lemar Lofty., MD;  Location: [facility];  Service: Gastroenterology;;   Fidela Salisbury RELEASE Right 2010   COLONOSCOPY WITH PROPOFOL N/A 05/08/2022   Procedure: COLONOSCOPY WITH PROPOFOL;  Surgeon: Lemar Lofty., MD;  Location: [facility];  Service: Gastroenterology;  Laterality: N/A;   ENDOSCOPIC MUCOSAL RESECTION N/A 05/08/2022   Procedure: ENDOSCOPIC MUCOSAL RESECTION;  Surgeon: Meridee Score Netty Starring., MD;  Location: [facility];  Service: Gastroenterology;  Laterality: N/A;   HERNIA REPAIR Bilateral    POLYPECTOMY  05/08/2022   Procedure: POLYPECTOMY;  Surgeon: Mansouraty, Netty Starring., MD;  Location: [facility];  Service: Gastroenterology;;    Prior to Admission medications   Medication Sig Start  Date End Date Taking? Authorizing Provider  anastrozole (ARIMIDEX) 1 MG tablet Take 1 mg by mouth every Tuesday. 06/10/21   [provider]  meloxicam (MOBIC) 15 MG tablet Take 15 mg by mouth daily. Patient not taking: Reported on 05/01/2023    [provider]  metoprolol tartrate (LOPRESSOR) 25 MG tablet Take 12.5 mg by mouth 2 (two) times daily with a meal.    [provider]  tadalafil (CIALIS) 10 MG tablet Take 10 mg by mouth daily as needed for erectile dysfunction. 12/05/21   [provider]  Testosterone Cypionate 200 MG/ML KIT Inject 0.5 mLs into the skin 2 (two) times a week. Patient not taking: Reported on 05/01/2023 05/20/21   [provider]  triamcinolone ointment (KENALOG) 0.5 % Apply 1 Application topically at bedtime. Eye lid Patient not taking: Reported on 05/01/2023 02/14/22   [provider]    Current Outpatient Medications  Medication Sig Dispense Refill   anastrozole (ARIMIDEX) 1 MG tablet Take 1 mg by mouth every Tuesday.     meloxicam (MOBIC) 15 MG tablet Take 15 mg by mouth daily. (Patient not taking: Reported on 05/01/2023)     metoprolol tartrate (LOPRESSOR) 25 MG tablet Take 12.5 mg by mouth 2 (two) times daily with a meal.     tadalafil (CIALIS) 10 MG tablet Take 10 mg by mouth daily as needed for erectile dysfunction.     Testosterone Cypionate 200 MG/ML KIT Inject 0.5 mLs into the skin 2 (two) times a week. (Patient not taking: Reported on 05/01/2023)  triamcinolone ointment (KENALOG) 0.5 % Apply 1 Application topically at bedtime. Eye lid (Patient not taking: Reported on 05/01/2023)     No current facility-administered medications for this visit.    Allergies as of 05/20/2023 - Reviewed 05/08/2023  Allergen Reaction Noted   Meperidine Anaphylaxis 04/21/2017    Family History  Problem Relation Age of Onset   Aneurysm Mother    Hypothyroidism Mother    Vitamin D deficiency Sister    Cancer Maternal Aunt     Cancer Maternal Uncle    Glaucoma Maternal Grandfather    Colon cancer Neg Hx    Colon polyps Neg Hx    Crohn's disease Neg Hx    Esophageal cancer Neg Hx    Rectal cancer Neg Hx    Stomach cancer Neg Hx    Ulcerative colitis Neg Hx     Social History   Socioeconomic History   Marital status: Married    Spouse name: Not on file   Number of children: Not on file   Years of education: Not on file   Highest education level: Not on file  Occupational History   Not on file  Tobacco Use   Smoking status: Former    Current packs/day: 0.00    Average packs/day: 1 pack/day for 20.0 years (20.0 ttl pk-yrs)    Types: Cigarettes    Start date: 70    Quit date: 2014    Years since quitting: 11.2    Passive exposure: Never   Smokeless tobacco: Former  Building services engineer status: Former  Substance and Sexual Activity   Alcohol use: Yes    Alcohol/week: 1.0 - 2.0 standard drink of alcohol    Types: 1 - 2 Standard drinks or equivalent per week    Comment: social   Drug use: Never   Sexual activity: Yes  Other Topics Concern   Not on file  Social History Narrative   Not on file   Social Drivers of Health   Financial Resource Strain: Low Risk  (03/17/2023)   Received from [facility]   Overall Financial Resource Strain (CARDIA)    Difficulty of Paying Living Expenses: Not hard at all  Food Insecurity: No Food Insecurity (03/17/2023)   Received from [facility]   Hunger Vital Sign    Worried About Running Out of Food in the Last Year: Never true    Ran Out of Food in the Last Year: Never true  Transportation Needs: No Transportation Needs (03/17/2023)   Received from [facility] - Transportation    Lack of Transportation (Medical): No    Lack of Transportation (Non-Medical): No  Physical Activity: Sufficiently Active (01/12/2023)   Received from [facility]   Exercise Vital Sign    Days of Exercise per Week: 4 days    Minutes of Exercise per Session:  90 min  Stress: Stress Concern Present (01/12/2023)   Received from [facility] - Occupational Stress Questionnaire    Feeling of Stress : Rather much  Social Connections: Somewhat Isolated (01/12/2023)   Received from [facility]   Social Network    How would you rate your social network (family, work, friends)?: Restricted participation with some degree of social isolation  Intimate Partner Violence: Not At Risk (01/12/2023)   Received from [facility]   HITS    Over the last 12 months how often did your partner physically hurt you?: Never  Over the last 12 months how often did your partner insult you or talk down to you?: Sometimes    Over the last 12 months how often did your partner threaten you with physical harm?: Never    Over the last 12 months how often did your partner scream or curse at you?: Sometimes    Physical Exam: Vital signs in last 24 hours: @There  were no vitals taken for this visit. GEN: NAD EYE: Sclerae anicteric ENT: MMM CV: Non-tachycardic Pulm: CTA b/l GI: Soft, NT/ND NEURO:  Alert & Oriented x 3   Doristine Locks, DO [facility]   05/20/2023 8:30 AM

## 2023-05-20 NOTE — Op Note (Signed)
 [facility] Patient Name: Jordan Smith Procedure Date: 05/20/2023 9:26 AM MRN: [id number] Endoscopist: Doristine Locks , MD, [id number] Age: 48 Referring MD:  Date of Birth: Sep 05, 1975 Gender: Male Account #: [id number] Procedure:                Upper GI endoscopy Indications:              Early satiety, Nausea Medicines:                Monitored Anesthesia Care Procedure:                Pre-Anesthesia Assessment:                           - Prior to the procedure, a History and Physical                            was performed, and patient medications and                            allergies were reviewed. The patient's tolerance of                            previous anesthesia was also reviewed. The risks                            and benefits of the procedure and the sedation                            options and risks were discussed with the patient.                            All questions were answered, and informed consent                            was obtained. Prior Anticoagulants: The patient has                            taken no anticoagulant or antiplatelet agents. ASA                            Grade Assessment: II - A patient with mild systemic                            disease. After reviewing the risks and benefits,                            the patient was deemed in satisfactory condition to                            undergo the procedure.                           After obtaining informed consent, the endoscope was  passed under direct vision. Throughout the                            procedure, the patient's blood pressure, pulse, and                            oxygen saturations were monitored continuously. The                            Olympus Scope SN [id number] was introduced through the                            mouth, and advanced to the second part of duodenum.                            The upper GI endoscopy was  accomplished without                            difficulty. The patient tolerated the procedure                            well. Scope In: Scope Out: Findings:                 LA Grade C (one or more mucosal breaks continuous                            between tops of 2 or more mucosal folds, less than                            75% circumference) esophagitis with no bleeding was                            found in the lower third of the esophagus. Biopsies                            were taken with a cold forceps for histology.                            Estimated blood loss was minimal.                           The upper third of the esophagus and middle third                            of the esophagus were normal.                           The gastroesophageal flap valve was visualized                            endoscopically and classified as Hill Grade III                            (  minimal fold, loose to endoscope, hiatal hernia                            likely).                           Diffuse moderate inflammation characterized by                            congestion (edema) and erythema was found in the                            entire examined stomach. Biopsies were taken with a                            cold forceps for histology and Helicobacter pylori                            testing. Estimated blood loss was minimal.                           The examined duodenum was normal. Complications:            No immediate complications. Estimated Blood Loss:     Estimated blood loss was minimal. Impression:               - LA Grade C esophagitis with no bleeding. Biopsied.                           - Normal upper third of esophagus and middle third                            of esophagus.                           - Gastroesophageal flap valve classified as Hill                            Grade III (minimal fold, loose to endoscope, hiatal                             hernia likely).                           - Gastritis. Biopsied.                           - Normal examined duodenum. Recommendation:           - Patient has a contact number available for                            emergencies. The signs and symptoms of potential                            delayed complications were discussed with the  patient. Return to normal activities tomorrow.                            Written discharge instructions were provided to the                            patient.                           - Resume previous diet.                           - Continue present medications.                           - Use Protonix (pantoprazole) 40 mg PO BID for 8                            weeks, then reduce to 40 mg daily if upper GI                            symptoms improved/controlled.                           - Use sucralfate tablets 1 gram PO QID for 2 weeks.                           - Await pathology results.                           - Return to GI clinic at appointment to be                            scheduled. Will discuss role/utility and timing of                            repeat upper endoscopy to evaluate for appropriate                            mucosal healing based on biopsy results and                            response to therapy. Doristine Locks, MD 05/20/2023 9:48:06 AM

## 2023-05-20 NOTE — Progress Notes (Signed)
 Called to room to assist during endoscopic procedure.  Patient ID and intended procedure confirmed with present staff. Received instructions for my participation in the procedure from the performing physician.

## 2023-05-20 NOTE — Patient Instructions (Addendum)
-   Resume previous diet. - Continue present medications. - Use Protonix (pantoprazole) 40 mg PO BID for 8 weeks, then reduce to 40 mg daily if upper GI symptoms improved/controlled. - Use sucralfate tablets 1 gram PO QID for 2 weeks. - Await pathology results.   YOU HAD AN ENDOSCOPIC PROCEDURE TODAY AT THE [facility]:   Refer to the procedure report that was given to you for any specific questions about what was found during the examination.  If the procedure report does not answer your questions, please call your gastroenterologist to clarify.  If you requested that your care partner not be given the details of your procedure findings, then the procedure report has been included in a sealed envelope for you to review at your convenience later.  YOU SHOULD EXPECT: Some feelings of bloating in the abdomen. Passage of more gas than usual.  Walking can help get rid of the air that was put into your GI tract during the procedure and reduce the bloating. If you had a lower endoscopy (such as a colonoscopy or flexible sigmoidoscopy) you may notice spotting of blood in your stool or on the toilet paper. If you underwent a bowel prep for your procedure, you may not have a normal bowel movement for a few days.  Please Note:  You might notice some irritation and congestion in your nose or some drainage.  This is from the oxygen used during your procedure.  There is no need for concern and it should clear up in a day or so.  SYMPTOMS TO REPORT IMMEDIATELY: Following upper endoscopy (EGD)  Vomiting of blood or coffee ground material  New chest pain or pain under the shoulder blades  Painful or persistently difficult swallowing  New shortness of breath  Fever of 100F or higher  Black, tarry-looking stools  For urgent or emergent issues, a gastroenterologist can be reached at any hour by calling [phone]. Do not use MyChart messaging for urgent concerns.    DIET:  We do recommend a  small meal at first, but then you may proceed to your regular diet.  Drink plenty of fluids but you should avoid alcoholic beverages for 24 hours.  ACTIVITY:  You should plan to take it easy for the rest of today and you should NOT DRIVE or use heavy machinery until tomorrow (because of the sedation medicines used during the test).    FOLLOW UP: Our staff will call the number listed on your records the next business day following your procedure.  We will call around 7:15- 8:00 am to check on you and address any questions or concerns that you may have regarding the information given to you following your procedure. If we do not reach you, we will leave a message.     If any biopsies were taken you will be contacted by phone or by letter within the next 1-3 weeks.  Please call us at [phone] if you have not heard about the biopsies in 3 weeks.    SIGNATURES/CONFIDENTIALITY: You and/or your care partner have signed paperwork which will be entered into your electronic medical record.  These signatures attest to the fact that that the information above on your After Visit Summary has been reviewed and is understood.  Full responsibility of the confidentiality of this discharge information lies with you and/or your care-partner.

## 2023-05-20 NOTE — Progress Notes (Signed)
 Pt's states no medical or surgical changes since previsit or office visit.

## 2023-05-21 ENCOUNTER — Telehealth: Payer: Self-pay

## 2023-05-21 NOTE — Telephone Encounter (Signed)
  Follow up Call-     05/20/2023    8:41 AM 01/13/2022    2:13 PM  Call back number  Post procedure Call Back phone  # [phone]  Permission to leave phone message Yes Yes     Patient questions:  Do you have a fever, pain , or abdominal swelling? No. Pain Score  0 *  Have you tolerated food without any problems? Yes.    Have you been able to return to your normal activities? Yes.    Do you have any questions about your discharge instructions: Diet   No. Medications  No. Follow up visit  No.  Do you have questions or concerns about your Care? No.  Actions: * If pain score is 4 or above: No action needed, pain <4.

## 2023-05-22 ENCOUNTER — Ambulatory Visit: Attending: Internal Medicine

## 2023-05-22 ENCOUNTER — Ambulatory Visit: Attending: Internal Medicine | Admitting: Internal Medicine

## 2023-05-22 ENCOUNTER — Encounter: Payer: Self-pay | Admitting: Internal Medicine

## 2023-05-22 VITALS — BP 106/72 | HR 80 | Ht 72.0 in | Wt 250.0 lb

## 2023-05-22 DIAGNOSIS — R002 Palpitations: Secondary | ICD-10-CM | POA: Diagnosis not present

## 2023-05-22 DIAGNOSIS — Z6834 Body mass index (BMI) 34.0-34.9, adult: Secondary | ICD-10-CM

## 2023-05-22 LAB — SURGICAL PATHOLOGY

## 2023-05-22 NOTE — Patient Instructions (Signed)
 Medication Instructions:  Your physician has recommended you make the following change in your medication:   1) STOP metoprolol  *If you need a refill on your cardiac medications before your next appointment, please call your pharmacy*  Lab Work: TODAY: reflex TSH, CMP, CBC, Hgb A1c If you have labs (blood work) drawn today and your tests are completely normal, you will receive your results only by: MyChart Message (if you have MyChart) OR A paper copy in the mail If you have any lab test that is abnormal or we need to change your treatment, we will call you to review the results.  Testing/Procedures: Your physician has requested that you wear a Zio heart monitor for 5 days. This will be mailed to your home with instructions on how to apply the monitor and how to return it when finished. Please allow 2 weeks after returning the heart monitor before our office calls you with the results. .  Your physician has requested that you have an echocardiogram. Echocardiography is a painless test that uses sound waves to create images of your heart. It provides your doctor with information about the size and shape of your heart and how well your heart's chambers and valves are working. This procedure takes approximately one hour. There are no restrictions for this procedure. Please do NOT wear cologne, perfume, aftershave, or lotions (deodorant is allowed). Please arrive 15 minutes prior to your appointment time.  Please note: We ask at that you not bring children with you during ultrasound (echo/ vascular) testing. Due to room size and safety concerns, children are not allowed in the ultrasound rooms during exams. Our front office staff cannot provide observation of children in our lobby area while testing is being conducted. An adult accompanying a patient to their appointment will only be allowed in the ultrasound room at the discretion of the ultrasound technician under special circumstances. We  apologize for any inconvenience.  Follow-Up: At [facility], you and your health needs are our priority.  As part of our continuing mission to provide you with exceptional heart care, we have created designated Provider Care Teams.  These Care Teams include your primary Cardiologist (physician) and Advanced Practice Providers (APPs -  Physician Assistants and Nurse Practitioners) who all work together to provide you with the care you need, when you need it.  We recommend signing up for the patient portal called "MyChart".  Sign up information is provided on this After Visit Summary.  MyChart is used to connect with patients for Virtual Visits (Telemedicine).  Patients are able to view lab/test results, encounter notes, upcoming appointments, etc.  Non-urgent messages can be sent to your provider as well.   To learn more about what you can do with MyChart, go to [email or web address].    Your next appointment:   6 month(s)  The format for your next appointment:   In Person  Provider:   Orbie Pyo, MD {  Other Instructions Jordan Smith- Long Term Monitor Instructions     Your physician has requested you wear a ZIO patch monitor for 5 days.  This is a single patch monitor. [employer] supplies one patch monitor per enrollment. Additional  stickers are not available. Please do not apply patch if you will be having a Nuclear Stress Test,  Echocardiogram, Cardiac CT, MRI, or Chest Xray during the period you would be wearing the  monitor. The patch cannot be worn during these tests. You cannot remove and re-apply the  ZIO XT patch  monitor.  Your ZIO patch monitor will be mailed 3 day USPS to your address on file. It may take 3-5 days  to receive your monitor after you have been enrolled.  Once you have received your monitor, please review the enclosed instructions. Your monitor  has already been registered assigning a specific monitor serial # to you.     Billing and Patient Assistance  Program Information     We have supplied [employer] with any of your insurance information on file for billing purposes.  [employer] offers a sliding scale Patient Assistance Program for patients that do not have  insurance, or whose insurance does not completely cover the cost of the ZIO monitor.  You must apply for the Patient Assistance Program to qualify for this discounted rate.  To apply, please call [employer] at [phone], select option 4, select option 2, ask to apply for  Patient Assistance Program. Meredeth Ide will ask your household income, and how many people  are in your household. They will quote your out-of-pocket cost based on that information.  [employer] will also be able to set up a 70-month, interest-free payment plan if needed.     Applying the monitor     Shave hair from upper left chest.  Hold abrader disc by orange tab. Rub abrader in 40 strokes over the upper left chest as  indicated in your monitor instructions.  Clean area with 4 enclosed alcohol pads. Let dry.  Apply patch as indicated in monitor instructions. Patch will be placed under collarbone on left  side of chest with arrow pointing upward.  Rub patch adhesive wings for 2 minutes. Remove white label marked "1". Remove the white  label marked "2". Rub patch adhesive wings for 2 additional minutes.  While looking in a mirror, press and release button in center of patch. A small green light will  flash 3-4 times. This will be your only indicator that the monitor has been turned on.  Do not shower for the first 24 hours. You may shower after the first 24 hours.  Press the button if you feel a symptom. You will hear a small click. Record Date, Time and  Symptom in the Patient Logbook.  When you are ready to remove the patch, follow instructions on the last 2 pages of Patient  Logbook. Stick patch monitor onto the last page of Patient Logbook.  Place Patient Logbook in the blue and white box. Use locking tab on box and  tape box closed  securely. The blue and white box has prepaid postage on it. Please place it in the mailbox as  soon as possible. Your physician should have your test results approximately 7 days after the  monitor has been mailed back to [facility].  Call [facility] Customer Care at [phone] if you have questions regarding  your ZIO XT patch monitor. Call them immediately if you see an orange light blinking on your  monitor.  If your monitor falls off in less than 4 days, contact our Monitor department at [phone].  If your monitor becomes loose or falls off after 4 days call [employer] at [phone] for  suggestions on securing your monitor.      1st Floor: - Lobby - Registration  - Pharmacy  - Lab - Cafe  2nd Floor: - PV Lab - Diagnostic Testing (echo, CT, nuclear med)  3rd Floor: - Vacant  4th Floor: - TCTS (cardiothoracic surgery) - AFib Clinic - Structural Heart Clinic - Vascular Surgery  -  Vascular Ultrasound  5th Floor: - HeartCare Cardiology (general and EP) - Clinical Pharmacy for coumadin, hypertension, lipid, weight-loss medications, and med management appointments    Valet parking services will be available as well.

## 2023-05-22 NOTE — Progress Notes (Unsigned)
 Enrolled patient for a 7 day Zio XT monitor to be mailed to patients home.

## 2023-05-25 ENCOUNTER — Encounter: Payer: Self-pay | Admitting: Gastroenterology

## 2023-06-01 ENCOUNTER — Emergency Department (HOSPITAL_COMMUNITY)

## 2023-06-01 ENCOUNTER — Ambulatory Visit
Admission: EM | Admit: 2023-06-01 | Discharge: 2023-06-01 | Disposition: A | Discharge status: Home / Self Care | Attending: Nurse Practitioner | Admitting: Nurse Practitioner

## 2023-06-01 ENCOUNTER — Other Ambulatory Visit: Payer: Self-pay

## 2023-06-01 ENCOUNTER — Encounter (HOSPITAL_COMMUNITY): Payer: Self-pay

## 2023-06-01 ENCOUNTER — Emergency Department (HOSPITAL_COMMUNITY)
Admission: EM | Admit: 2023-06-01 | Discharge: 2023-06-02 | Disposition: A | Discharge status: Home / Self Care | Source: Home / Self Care | Attending: Emergency Medicine | Admitting: Emergency Medicine

## 2023-06-01 DIAGNOSIS — R112 Nausea with vomiting, unspecified: Secondary | ICD-10-CM

## 2023-06-01 DIAGNOSIS — K5010 Crohn's disease of large intestine without complications: Secondary | ICD-10-CM | POA: Insufficient documentation

## 2023-06-01 DIAGNOSIS — J4520 Mild intermittent asthma, uncomplicated: Secondary | ICD-10-CM | POA: Insufficient documentation

## 2023-06-01 DIAGNOSIS — R1084 Generalized abdominal pain: Secondary | ICD-10-CM

## 2023-06-01 DIAGNOSIS — K529 Noninfective gastroenteritis and colitis, unspecified: Secondary | ICD-10-CM | POA: Diagnosis not present

## 2023-06-01 DIAGNOSIS — R1030 Lower abdominal pain, unspecified: Secondary | ICD-10-CM

## 2023-06-01 DIAGNOSIS — K50119 Crohn's disease of large intestine with unspecified complications: Secondary | ICD-10-CM

## 2023-06-01 DIAGNOSIS — A09 Infectious gastroenteritis and colitis, unspecified: Secondary | ICD-10-CM | POA: Diagnosis not present

## 2023-06-01 LAB — COMPREHENSIVE METABOLIC PANEL WITH GFR
ALT: 77 U/L — ABNORMAL HIGH (ref 0–44)
AST: 98 U/L — ABNORMAL HIGH (ref 15–41)
Albumin: 4.1 g/dL (ref 3.5–5.0)
Alkaline Phosphatase: 80 U/L (ref 38–126)
Anion gap: 16 — ABNORMAL HIGH (ref 5–15)
BUN: 8 mg/dL (ref 6–20)
CO2: 25 mmol/L (ref 22–32)
Calcium: 9.7 mg/dL (ref 8.9–10.3)
Chloride: 97 mmol/L — ABNORMAL LOW (ref 98–111)
Creatinine, Ser: 0.93 mg/dL (ref 0.61–1.24)
GFR, Estimated: >60 mL/min (ref >60)
Glucose, Bld: 122 mg/dL — ABNORMAL HIGH (ref 70–99)
Potassium: 4.5 mmol/L (ref 3.5–5.1)
Sodium: 138 mmol/L (ref 135–145)
Total Bilirubin: 1.5 mg/dL — ABNORMAL HIGH (ref 0.0–1.2)
Total Protein: 7.6 g/dL (ref 6.5–8.1)

## 2023-06-01 LAB — CBC
HCT: 51.1 % (ref 39.0–52.0)
Hemoglobin: 17.4 g/dL — ABNORMAL HIGH (ref 13.0–17.0)
MCH: 32.8 pg (ref 26.0–34.0)
MCHC: 34.1 g/dL (ref 30.0–36.0)
MCV: 96.4 fL (ref 80.0–100.0)
Platelets: 254 10*3/uL (ref 150–400)
RBC: 5.3 MIL/uL (ref 4.22–5.81)
RDW: 14.6 % (ref 11.5–15.5)
WBC: 8.3 10*3/uL (ref 4.0–10.5)
nRBC: 0 % (ref 0.0–0.2)

## 2023-06-01 LAB — URINALYSIS, ROUTINE W REFLEX MICROSCOPIC
Bacteria, UA: NONE SEEN
Bilirubin Urine: NEGATIVE
Glucose, UA: NEGATIVE mg/dL
Hgb urine dipstick: NEGATIVE
Ketones, ur: 20 mg/dL — AB
Leukocytes,Ua: NEGATIVE
Nitrite: NEGATIVE
Protein, ur: 100 mg/dL — AB
Specific Gravity, Urine: 1.027 (ref 1.005–1.030)
pH: 5 (ref 5.0–8.0)

## 2023-06-01 LAB — POCT FASTING CBG KUC MANUAL ENTRY: POCT Glucose (KUC): 135 mg/dL — AB (ref 70–99)

## 2023-06-01 LAB — LIPASE, BLOOD: Lipase: 37 U/L (ref 11–51)

## 2023-06-01 MED ORDER — LACTATED RINGERS IV BOLUS
1000.0000 mL | Freq: Once | INTRAVENOUS | Status: AC
  Administered 2023-06-01: 1000 mL via INTRAVENOUS

## 2023-06-01 MED ORDER — HYDROMORPHONE HCL 1 MG/ML IJ SOLN
2.0000 mg | Freq: Once | INTRAMUSCULAR | Status: AC
  Administered 2023-06-01: 2 mg via INTRAVENOUS
  Filled 2023-06-01: qty 2

## 2023-06-01 MED ORDER — ONDANSETRON 4 MG PO TBDP
4.0000 mg | ORAL_TABLET | Freq: Once | ORAL | Status: AC
  Administered 2023-06-01: 4 mg via ORAL

## 2023-06-01 MED ORDER — HYDROMORPHONE HCL 1 MG/ML IJ SOLN
1.0000 mg | Freq: Once | INTRAMUSCULAR | Status: AC
  Administered 2023-06-01: 1 mg via INTRAVENOUS
  Filled 2023-06-01: qty 1

## 2023-06-01 MED ORDER — ONDANSETRON HCL 4 MG/2ML IJ SOLN
4.0000 mg | Freq: Once | INTRAMUSCULAR | Status: AC
  Administered 2023-06-01: 4 mg via INTRAVENOUS
  Filled 2023-06-01: qty 2

## 2023-06-01 MED ORDER — METHYLPREDNISOLONE SODIUM SUCC 125 MG IJ SOLR
125.0000 mg | Freq: Once | INTRAMUSCULAR | Status: AC
  Administered 2023-06-01: 125 mg via INTRAVENOUS
  Filled 2023-06-01: qty 2

## 2023-06-01 MED ORDER — IOHEXOL 300 MG/ML  SOLN
100.0000 mL | Freq: Once | INTRAMUSCULAR | Status: AC | PRN
  Administered 2023-06-01: 100 mL via INTRAVENOUS

## 2023-06-01 NOTE — Discharge Instructions (Signed)
Please go directly to the emergency room for further evaluation management of your symptoms

## 2023-06-01 NOTE — ED Provider Notes (Signed)
 [facility]    CSN: [id number] Arrival date & time: 06/01/23  1522      History   Chief Complaint Chief Complaint  Patient presents with   Nausea   Emesis    HPI Jordan Smith is a 48 y.o. male.   Patient presents today for 3-day history of abdominal pain, nausea, and vomiting.  Reports when symptoms initially began, he also had diarrhea but this is now subsided.  He denies passing any stool or gas since Friday evening.  He reports multiple episodes of vomiting today, has not been able to keep down any food or fluids.  Reports his abdomen feels distended and is painful to touch.  Also endorses tactile fever.  No known sick contacts.  No cough, congestion, sore throat.  Reports he feels dehydrated and shaky.  Patient denies history of abdominal surgeries.  Reports he had a colonoscopy about 1 year ago.  He had a CT scan of his abdomen a couple of weeks ago to check for reasoning for early satiety.  Denies history of abdominal surgeries.  Reports history of bilateral inguinal hernia repairs.    Past Medical History:  Diagnosis Date   Hyperlipidemia    Hypertension    Polycythemia    gives blood evey 2 weeks   Sleep apnea    c pap    Patient Active Problem List   Diagnosis Date Noted   Hepatic steatosis 04/13/2019   Mild intermittent asthma without complication 04/13/2019   Class 2 obesity due to excess calories with body mass index (BMI) of 35.0 to 35.9 in adult 04/13/2019   Unilateral inguinal hernia without obstruction or gangrene 03/22/2019   Reactive airway disease 03/22/2019   Erectile dysfunction due to arterial insufficiency 03/22/2019   Healthcare maintenance 03/22/2019   Elevated LFTs 03/22/2019   Close exposure to COVID-19 virus 03/22/2019   Elevated LDL cholesterol level 03/22/2019    Past Surgical History:  Procedure Laterality Date   BIOPSY  05/08/2022   Procedure: BIOPSY;  Surgeon: Lemar Lofty., MD;  Location: [facility];   Service: Gastroenterology;;   Fidela Salisbury RELEASE Right 2010   COLONOSCOPY WITH PROPOFOL N/A 05/08/2022   Procedure: COLONOSCOPY WITH PROPOFOL;  Surgeon: Lemar Lofty., MD;  Location: [facility];  Service: Gastroenterology;  Laterality: N/A;   ENDOSCOPIC MUCOSAL RESECTION N/A 05/08/2022   Procedure: ENDOSCOPIC MUCOSAL RESECTION;  Surgeon: Meridee Score Netty Starring., MD;  Location: [facility];  Service: Gastroenterology;  Laterality: N/A;   HERNIA REPAIR Bilateral    POLYPECTOMY  05/08/2022   Procedure: POLYPECTOMY;  Surgeon: Mansouraty, Netty Starring., MD;  Location: [facility];  Service: Gastroenterology;;       Home Medications    Prior to Admission medications   Medication Sig Start Date End Date Taking? Authorizing Provider  anastrozole (ARIMIDEX) 1 MG tablet Take 1 mg by mouth every Tuesday. 06/10/21  Yes [provider]  CARAFATE 1 g tablet Take sucralfate tablets 1 gram PO QID for 2 weeks. 05/20/23  Yes Cirigliano, Vito V, DO  pantoprazole (PROTONIX) 40 MG tablet Take 1 tablet (40 mg total) by mouth 2 (two) times daily for 60 days, THEN 1 tablet (40 mg total) daily. Take Protonix (pantoprazole) 40 mg PO BID for 8 weeks, then reduce to 40 mg daily if upper GI. 05/20/23 09/17/23 Yes Cirigliano, Vito V, DO  meloxicam (MOBIC) 15 MG tablet Take 15 mg by mouth daily. Patient not taking: Reported on 05/01/2023    [provider]  tadalafil (CIALIS)  10 MG tablet Take 10 mg by mouth daily as needed for erectile dysfunction. 12/05/21   [provider]  testosterone cypionate (DEPOTESTOSTERONE CYPIONATE) 200 MG/ML injection SMARTSIG:0.5 Milliliter(s) IM Twice a Week 02/03/23   [provider]  triamcinolone ointment (KENALOG) 0.5 % Apply 1 Application topically at bedtime. Eye lid Patient not taking: Reported on 05/01/2023 02/14/22   [provider]    Family History Family History  Problem Relation Age of Onset   Aneurysm Mother     Hypothyroidism Mother    Vitamin D deficiency Sister    Cancer Maternal Aunt    Cancer Maternal Uncle    Glaucoma Maternal Grandfather    Colon cancer Neg Hx    Colon polyps Neg Hx    Crohn's disease Neg Hx    Esophageal cancer Neg Hx    Rectal cancer Neg Hx    Stomach cancer Neg Hx    Ulcerative colitis Neg Hx     Social History Social History   Tobacco Use   Smoking status: Former    Current packs/day: 0.00    Average packs/day: 1 pack/day for 20.0 years (20.0 ttl pk-yrs)    Types: Cigarettes    Start date: 13    Quit date: 2014    Years since quitting: 11.2    Passive exposure: Never   Smokeless tobacco: Former  Building services engineer status: Former  Substance Use Topics   Alcohol use: Yes    Alcohol/week: 1.0 - 2.0 standard drink of alcohol    Types: 1 - 2 Standard drinks or equivalent per week    Comment: social   Drug use: Never     Allergies   Meperidine   Review of Systems Review of Systems Per HPI  Physical Exam Triage Vital Signs ED Triage Vitals  Encounter Vitals Group     BP 06/01/23 1659 (!) 160/97     Systolic BP Percentile --      Diastolic BP Percentile --      Pulse Rate 06/01/23 1617 (!) 109     Resp 06/01/23 1617 18     Temp 06/01/23 1617 98.8 F (37.1 C)     Temp Source 06/01/23 1617 Oral     SpO2 06/01/23 1617 95 %     Weight --      Height --      Head Circumference --      Peak Flow --      Pain Score 06/01/23 1620 9     Pain Loc --      Pain Education --      Exclude from Growth Chart --    No data found.  Updated Vital Signs BP (!) 158/98 (BP Location: Left Arm)   Pulse (!) 109   Temp 98.8 F (37.1 C) (Oral)   Resp 18   SpO2 95%   Visual Acuity Right Eye Distance:   Left Eye Distance:   Bilateral Distance:    Right Eye Near:   Left Eye Near:    Bilateral Near:     Physical Exam Vitals and nursing note reviewed.  Constitutional:      General: He is not in acute distress.    Appearance: Normal appearance.  He is ill-appearing and diaphoretic. He is not toxic-appearing.  HENT:     Head: Normocephalic and atraumatic.     Mouth/Throat:     Mouth: Mucous membranes are moist.     Pharynx: Oropharynx is clear. No posterior oropharyngeal  erythema.  Cardiovascular:     Rate and Rhythm: Regular rhythm. Tachycardia present.  Pulmonary:     Effort: Pulmonary effort is normal. No respiratory distress.     Breath sounds: Normal breath sounds. No wheezing, rhonchi or rales.  Abdominal:     General: Abdomen is flat. Bowel sounds are decreased. There is distension.     Palpations: Abdomen is rigid.     Tenderness: There is generalized abdominal tenderness. There is no right CVA tenderness, left CVA tenderness, guarding or rebound.  Musculoskeletal:     Cervical back: Normal range of motion.  Lymphadenopathy:     Cervical: No cervical adenopathy.  Skin:    General: Skin is warm.     Capillary Refill: Capillary refill takes less than 2 seconds.     Coloration: Skin is not jaundiced or pale.     Findings: No erythema.  Neurological:     Mental Status: He is alert.     Motor: No weakness.     Gait: Gait normal.  Psychiatric:        Behavior: Behavior is cooperative.      UC Treatments / Results  Labs (all labs ordered are listed, but only abnormal results are displayed) Labs Reviewed  POCT FASTING CBG [facility] MANUAL ENTRY - Abnormal; Notable for the following components:      Result Value   POCT Glucose ([facility]) 135 (*)    All other components within normal limits    EKG   Radiology No results found.  Procedures Procedures (including critical care time)  Medications Ordered in UC Medications  ondansetron (ZOFRAN-ODT) disintegrating tablet 4 mg (4 mg Oral Given 06/01/23 1641)    Initial Impression / Assessment and Plan / UC Course  I have reviewed the triage vital signs and the nursing notes.  Pertinent labs & imaging results that were available during my care of the patient were  reviewed by me and considered in my medical decision making (see chart for details).   Patient is hypertensive and tachycardic in triage, otherwise vital signs are stable.  1. Lower abdominal pain 2. Nausea and vomiting, unspecified vomiting type Vitals are reassuring in triage History and exam is concerning for bowel obstruction; blood sugar is not low today I recommended further evaluation and management in the emergency room and patient is in agreement to plan Patient is stable to transport via private vehicle at this time  The patient was given the opportunity to ask questions.  All questions answered to their satisfaction.  The patient is in agreement to this plan.   Final Clinical Impressions(s) / UC Diagnoses   Final diagnoses:  Lower abdominal pain  Nausea and vomiting, unspecified vomiting type     Discharge Instructions      Please go directly to the emergency room for further evaluation management of your symptoms   ED Prescriptions   None    PDMP not reviewed this encounter.   Wilhemena Harbour, NP 06/01/23 [phone]

## 2023-06-01 NOTE — ED Provider Notes (Incomplete)
 [place] EMERGENCY DEPARTMENT AT [facility] Provider Note   CSN: [id number] Arrival date & time: 06/01/23  1719     History {Add pertinent medical, surgical, social history, OB history to HPI:1} Chief Complaint  Patient presents with   Abdominal Pain    Jordan Smith is a 48 y.o. male with past medical history of mild intermittent asthma, hepatic steatosis, palpitations presents to emergency department for evaluation of nausea, vomiting, diarrhea, abdominal pain that woke him up out of sleep at midnight on Friday, three days ago. Pain radiates into bilateral lower back. Describes pain as "achy and sore" when he does not move but will have sharp episodes. Has only been able to keep small sips of water down at most. No abdominal surgeries.  Wife at bedside has no similar symptoms.  No fevers.  Was originally evaluated at urgent care who recommended ED evaluation for suspected obstruction.  Of note, was seen by [facility] on 05/01/23 for nausea and early satiety. Last colonoscopy was 04/2022 with repeat in 3 yrs recommended. Hx of polyps, grad 2 internal hemorrhoids. Had EGD on 05/20/23 showing reactive gastritis and reflux esophagitis.   Abdominal Pain Associated symptoms: no chest pain, no chills, no constipation, no cough, no diarrhea, no fatigue, no fever, no nausea, no shortness of breath and no vomiting       Home Medications Prior to Admission medications   Medication Sig Start Date End Date Taking? Authorizing Provider  anastrozole (ARIMIDEX) 1 MG tablet Take 1 mg by mouth every Tuesday. 06/10/21   [provider]  CARAFATE 1 g tablet Take sucralfate tablets 1 gram PO QID for 2 weeks. 05/20/23   Cirigliano, Vito V, DO  meloxicam (MOBIC) 15 MG tablet Take 15 mg by mouth daily. Patient not taking: Reported on 05/01/2023    [provider]  pantoprazole (PROTONIX) 40 MG tablet Take 1 tablet (40 mg total) by mouth 2 (two) times daily for 60 days, THEN 1  tablet (40 mg total) daily. Take Protonix (pantoprazole) 40 mg PO BID for 8 weeks, then reduce to 40 mg daily if upper GI. 05/20/23 09/17/23  Cirigliano, Vito V, DO  tadalafil (CIALIS) 10 MG tablet Take 10 mg by mouth daily as needed for erectile dysfunction. 12/05/21   [provider]  testosterone cypionate (DEPOTESTOSTERONE CYPIONATE) 200 MG/ML injection SMARTSIG:0.5 Milliliter(s) IM Twice a Week 02/03/23   [provider]  triamcinolone ointment (KENALOG) 0.5 % Apply 1 Application topically at bedtime. Eye lid Patient not taking: Reported on 05/01/2023 02/14/22   [provider]      Allergies    Meperidine    Review of Systems   Review of Systems  Constitutional:  Negative for chills, fatigue and fever.  Respiratory:  Negative for cough, chest tightness, shortness of breath and wheezing.   Cardiovascular:  Negative for chest pain and palpitations.  Gastrointestinal:  Positive for abdominal pain. Negative for constipation, diarrhea, nausea and vomiting.  Neurological:  Negative for dizziness, seizures, weakness, light-headedness, numbness and headaches.    Physical Exam Updated Vital Signs BP (!) 156/119 (BP Location: Left Arm)   Pulse (!) 107   Temp 98.5 F (36.9 C) (Oral)   Resp 18   Ht 6' (1.829 m)   Wt 113.4 kg   SpO2 99%   BMI 33.91 kg/m  Physical Exam Vitals and nursing note reviewed.  Constitutional:      General: He is not in acute distress.    Appearance: Normal appearance.  HENT:     Head: Normocephalic and atraumatic.  Eyes:     Conjunctiva/sclera: Conjunctivae normal.  Cardiovascular:     Rate and Rhythm: Tachycardia present.  Pulmonary:     Effort: Pulmonary effort is normal. No respiratory distress.     Breath sounds: Normal breath sounds.  Chest:     Chest wall: No tenderness.  Abdominal:     General: Bowel sounds are decreased. There is distension.     Palpations: Abdomen is rigid.     Tenderness: There is generalized  abdominal tenderness.  Musculoskeletal:     Right lower leg: No edema.     Left lower leg: No edema.  Skin:    Coloration: Skin is not jaundiced or pale.  Neurological:     Mental Status: He is alert. Mental status is at baseline.     ED Results / Procedures / Treatments   Labs (all labs ordered are listed, but only abnormal results are displayed) Labs Reviewed  COMPREHENSIVE METABOLIC PANEL WITH GFR - Abnormal; Notable for the following components:      Result Value   Chloride 97 (*)    Glucose, Bld 122 (*)    AST 98 (*)    ALT 77 (*)    Total Bilirubin 1.5 (*)    Anion gap 16 (*)    All other components within normal limits  CBC - Abnormal; Notable for the following components:   Hemoglobin 17.4 (*)    All other components within normal limits  URINALYSIS, ROUTINE W REFLEX MICROSCOPIC - Abnormal; Notable for the following components:   Color, Urine AMBER (*)    Ketones, ur 20 (*)    Protein, ur 100 (*)    All other components within normal limits  LIPASE, BLOOD    EKG None  Radiology No results found.  Procedures Procedures  {Document cardiac monitor, telemetry assessment procedure when appropriate:1}  Medications Ordered in ED Medications - No data to display  ED Course/ Medical Decision Making/ A&P   {   Click here for ABCD2, HEART and other calculatorsREFRESH Note before signing :1}                              Medical Decision Making Amount and/or Complexity of Data Reviewed Labs: ordered. Radiology: ordered.  Risk Prescription drug management.   Patient presents to the ED for concern of abd pain, NVD, this involves an extensive number of treatment options, and is a complaint that carries with it a high risk of complications and morbidity.  The differential diagnosis includes gastritis, bacterial infection, obstruction, perforation, diverticulitis, constipation   Co morbidities that complicate the patient evaluation  See HPI   Additional  history obtained:  Additional history obtained from *** {Blank multiple:19196::"EMS","Family","Nursing","Outside Medical Records","Past Admission"}   External records from outside source obtained and reviewed including triage RN note, GI note from 05/01/2023, EGD results from 1425   Lab Tests:  I Ordered, and personally interpreted labs.  The pertinent results include:   Hemoglobin 17.4 Transaminitis (AST 98 ALT 77) Total bili 1.5 Anion gap 16 UA noninfectious   Imaging Studies ordered:  I ordered imaging studies including CT abdomen pelvis with contrast I independently visualized and interpreted imaging which showed *** I agree with the radiologist interpretation     Medicines ordered and prescription drug management:  I ordered medication including Dilaudid, Zofran, LR for pain nausea dehydration Reevaluation of the patient after these medicines  showed that the patient improved I have reviewed the patients home medicines and have made adjustments as needed   Test Considered:  ***   Critical Interventions:  ***   Consultations Obtained:  I requested consultation with the ***,  and discussed lab and imaging findings as well as pertinent plan - they recommend: ***   Problem List / ED Course:  ***   Reevaluation:  After the interventions noted above, I reevaluated the patient and found that they have :{resolved/improved/worsened:23923::"improved"}     Dispostion:  After consideration of the diagnostic results and the patients response to treatment, I feel that the patent would benefit from ***.    {Document critical care time when appropriate:1} {Document review of labs and clinical decision tools ie heart score, Chads2Vasc2 etc:1}  {Document your independent review of radiology images, and any outside records:1} {Document your discussion with family members, caretakers, and with consultants:1} {Document social determinants of health affecting pt's  care:1} {Document your decision making why or why not admission, treatments were needed:1} Final Clinical Impression(s) / ED Diagnoses Final diagnoses:  None    Rx / DC Orders ED Discharge Orders     None

## 2023-06-01 NOTE — ED Triage Notes (Signed)
 Pt c/o generalized abdominal pain. Pt states n/v/d for a couple of days. Pt states diarrhea has subsided but depending on how he sits will throw up. Pt has half drunken water bottle and states it has been all he has been able to hold down all day.

## 2023-06-01 NOTE — ED Triage Notes (Signed)
 Nausea, diarrhea, vomiting, abdominal pain/ cramping x 3 days. Pt states he is not abel to keep anything down.

## 2023-06-01 NOTE — ED Notes (Signed)
 Patient is being discharged from the Urgent Care and sent to the Emergency Department via POV. Per Thena Fireman, NP, patient is in need of higher level of care due to abdominal pain with concern for bowel obstruction. Patient is aware and verbalizes understanding of plan of care.  Vitals:   06/01/23 1659 06/01/23 1702  BP: (!) 160/97 (!) 158/98  Pulse:    Resp:    Temp:    SpO2:

## 2023-06-02 ENCOUNTER — Other Ambulatory Visit: Payer: Self-pay | Admitting: Gastroenterology

## 2023-06-02 ENCOUNTER — Telehealth: Payer: Self-pay | Admitting: Gastroenterology

## 2023-06-02 DIAGNOSIS — K529 Noninfective gastroenteritis and colitis, unspecified: Secondary | ICD-10-CM

## 2023-06-02 MED ORDER — METRONIDAZOLE 500 MG PO TABS: 500.0000 mg | ORAL_TABLET | Freq: Three times a day (TID) | ORAL | 0 refills | Status: DC

## 2023-06-02 MED ORDER — METOCLOPRAMIDE HCL 5 MG/ML IJ SOLN
10.0000 mg | Freq: Once | INTRAMUSCULAR | Status: AC
  Administered 2023-06-02: 10 mg via INTRAVENOUS
  Filled 2023-06-02: qty 2

## 2023-06-02 MED ORDER — PREDNISONE 10 MG (21) PO TBPK: ORAL_TABLET | Freq: Every day | ORAL | 0 refills | Status: DC

## 2023-06-02 MED ORDER — ONDANSETRON 4 MG PO TBDP: 4.0000 mg | ORAL_TABLET | Freq: Three times a day (TID) | ORAL | 0 refills | Status: DC | PRN

## 2023-06-02 MED ORDER — CIPROFLOXACIN HCL 500 MG PO TABS: 500.0000 mg | ORAL_TABLET | Freq: Two times a day (BID) | ORAL | 0 refills | Status: DC

## 2023-06-02 NOTE — Progress Notes (Signed)
 cipro 500 bid/flagyl 500 tid x 7 days for suspected infectious enterocolitis sent

## 2023-06-02 NOTE — Telephone Encounter (Signed)
 Called and spoke with patient. Patient reports that on Friday (05/29/23) he developed abdominal pain and cramping associated with diarrhea and vomiting. Patient was unable to tolerate anything PO so he went to Urgent Care and they advised that he go to ED for evaluation. Patient had a CT scan that was suggestive of Crohn's disease. Patient has been advised that he will need to take Prednisone taper as prescribed to help get symptoms under control. Patient has been advised that he can take Prednisone with his other prescribed meds. Patient is anxious about CT findings since his wife had severe Crohn's disease which led her to have a surgical colon resection. Patient would like to keep follow up as scheduled with Dr. Karene Oto, he will call us  in the interim if anything changes regarding his symptoms. Patient is aware that Dr. Karene Oto is out of the office until 4/24 but we will check and see if he would like any further tests completed prior to his follow up appointment. Pt had no concerns at the end of the call.

## 2023-06-02 NOTE — Telephone Encounter (Signed)
 Called patient to follow-up on the ED visit last night.  Patient states Friday night he had diarrhea followed by abdominal cramping nausea and vomiting.  Patient states he went to ED and had a CT scan where they mentioned he had inflammation in the terminal ileum. Patient given steroid taper and ondansetron for nausea. He states today he is feeling alittle better today but has not been eating much. Will discuss case with Dr. Yvone Herd and place orders to further evaluate.

## 2023-06-02 NOTE — Telephone Encounter (Signed)
 Patient called requesting to speak with a nurse regarding his recent ED visit, also stated he was given Prednisone and would like to you if that would be okay for him to take along with his other medication.

## 2023-06-02 NOTE — Discharge Instructions (Addendum)
 Thank you for letting us  evaluate you today.  Your CT scan showed a possible Crohn's flareup.  I provided you with a steroid here in emergency department as well as a steroid taper and Zofran until you are able to follow-up with your GI doctor for further management.   Treatment for Crohn's flare is IV fluid, bowel rest, steroids.  Please slowly introduce small foods into your diet.  Start with brat diet (bananas, rice, applesauce, toast)

## 2023-06-03 ENCOUNTER — Other Ambulatory Visit: Payer: Self-pay

## 2023-06-03 ENCOUNTER — Telehealth: Payer: Self-pay | Admitting: Gastroenterology

## 2023-06-03 ENCOUNTER — Other Ambulatory Visit (INDEPENDENT_AMBULATORY_CARE_PROVIDER_SITE_OTHER)

## 2023-06-03 DIAGNOSIS — R6881 Early satiety: Secondary | ICD-10-CM

## 2023-06-03 DIAGNOSIS — K209 Esophagitis, unspecified without bleeding: Secondary | ICD-10-CM

## 2023-06-03 DIAGNOSIS — K2970 Gastritis, unspecified, without bleeding: Secondary | ICD-10-CM

## 2023-06-03 DIAGNOSIS — D124 Benign neoplasm of descending colon: Secondary | ICD-10-CM

## 2023-06-03 DIAGNOSIS — K760 Fatty (change of) liver, not elsewhere classified: Secondary | ICD-10-CM

## 2023-06-03 DIAGNOSIS — K640 First degree hemorrhoids: Secondary | ICD-10-CM

## 2023-06-03 DIAGNOSIS — D122 Benign neoplasm of ascending colon: Secondary | ICD-10-CM

## 2023-06-03 DIAGNOSIS — R110 Nausea: Secondary | ICD-10-CM

## 2023-06-03 DIAGNOSIS — K529 Noninfective gastroenteritis and colitis, unspecified: Secondary | ICD-10-CM

## 2023-06-03 DIAGNOSIS — R748 Abnormal levels of other serum enzymes: Secondary | ICD-10-CM

## 2023-06-03 DIAGNOSIS — Z1211 Encounter for screening for malignant neoplasm of colon: Secondary | ICD-10-CM

## 2023-06-03 DIAGNOSIS — Z860100 Personal history of colon polyps, unspecified: Secondary | ICD-10-CM

## 2023-06-03 DIAGNOSIS — D128 Benign neoplasm of rectum: Secondary | ICD-10-CM

## 2023-06-03 DIAGNOSIS — R7401 Elevation of levels of liver transaminase levels: Secondary | ICD-10-CM

## 2023-06-03 DIAGNOSIS — D129 Benign neoplasm of anus and anal canal: Secondary | ICD-10-CM

## 2023-06-03 DIAGNOSIS — K2100 Gastro-esophageal reflux disease with esophagitis, without bleeding: Secondary | ICD-10-CM

## 2023-06-03 LAB — COMPREHENSIVE METABOLIC PANEL WITH GFR
ALT: 64 U/L — ABNORMAL HIGH (ref 0–53)
AST: 128 U/L — ABNORMAL HIGH (ref 0–37)
Albumin: 4.6 g/dL (ref 3.5–5.2)
Alkaline Phosphatase: 69 U/L (ref 39–117)
BUN: 9 mg/dL (ref 6–23)
CO2: 32 meq/L (ref 19–32)
Calcium: 9.8 mg/dL (ref 8.4–10.5)
Chloride: 93 meq/L — ABNORMAL LOW (ref 96–112)
Creatinine, Ser: 1.01 mg/dL (ref 0.40–1.50)
GFR: 87.46 mL/min (ref >60.00)
Glucose, Bld: 107 mg/dL — ABNORMAL HIGH (ref 70–99)
Potassium: 3.9 meq/L (ref 3.5–5.1)
Sodium: 137 meq/L (ref 135–145)
Total Bilirubin: 1.3 mg/dL — ABNORMAL HIGH (ref 0.2–1.2)
Total Protein: 7.4 g/dL (ref 6.0–8.3)

## 2023-06-03 LAB — CBC WITH DIFFERENTIAL/PLATELET
Basophils Absolute: 0 10*3/uL (ref 0.0–0.1)
Basophils Relative: 0.7 % (ref 0.0–3.0)
Eosinophils Absolute: 0 10*3/uL (ref 0.0–0.7)
Eosinophils Relative: 0.1 % (ref 0.0–5.0)
HCT: 51.5 % (ref 39.0–52.0)
Hemoglobin: 17.2 g/dL — ABNORMAL HIGH (ref 13.0–17.0)
Lymphocytes Relative: 9.4 % — ABNORMAL LOW (ref 12.0–46.0)
Lymphs Abs: 0.7 10*3/uL (ref 0.7–4.0)
MCHC: 33.3 g/dL (ref 30.0–36.0)
MCV: 98.8 fl (ref 78.0–100.0)
Monocytes Absolute: 0.7 10*3/uL (ref 0.1–1.0)
Monocytes Relative: 9.4 % (ref 3.0–12.0)
Neutro Abs: 5.8 10*3/uL (ref 1.4–7.7)
Neutrophils Relative %: 80.4 % — ABNORMAL HIGH (ref 43.0–77.0)
Platelets: 245 10*3/uL (ref 150.0–400.0)
RBC: 5.21 Mil/uL (ref 4.22–5.81)
RDW: 15.8 % — ABNORMAL HIGH (ref 11.5–15.5)
WBC: 7.2 10*3/uL (ref 4.0–10.5)

## 2023-06-03 LAB — IBC + FERRITIN
Ferritin: 199.7 ng/mL (ref 22.0–322.0)
Iron: 159 ug/dL (ref 42–165)
Saturation Ratios: 42.9 % (ref 20.0–50.0)
TIBC: 371 ug/dL (ref 250.0–450.0)
Transferrin: 265 mg/dL (ref 212.0–360.0)

## 2023-06-03 LAB — C-REACTIVE PROTEIN: CRP: 1.5 mg/dL (ref 0.5–20.0)

## 2023-06-03 LAB — SEDIMENTATION RATE: Sed Rate: 5 mm/h (ref 0–15)

## 2023-06-03 LAB — MAGNESIUM: Magnesium: 1.6 mg/dL (ref 1.5–2.5)

## 2023-06-03 NOTE — Progress Notes (Signed)
 cbc

## 2023-06-03 NOTE — Telephone Encounter (Signed)
 Called patient and wife Jordan Smith- she states her husband started with hallucinations this morning at 10 am. Patient was half way napping with lucid dreams. This was prior to taking the first dose of flaygl which he started today. His wife states he has lost track of time since Thursday-Friday. She reports last meal was Sunday night. Cheesy rice last night. She states he has not been drinking very much. His wife is concerned with dehydration due to poor oral intake.   They agreed to come in for labs cbc, cmp, magnesium, crp and ESR.  Recommended schedule ondansetron every 6 hours  Recommended hydration with electrolytes   If symptoms get worse or unable to take in oral hydration recommended he go to ER for IV fluids.

## 2023-06-03 NOTE — Telephone Encounter (Signed)
 Patients wife  Jordan Smith called requesting to speak with nurse regarding patient, she states he has began to have hallucinations. Requesting a call back at [phone]. Please advise.

## 2023-06-05 ENCOUNTER — Inpatient Hospital Stay (HOSPITAL_BASED_OUTPATIENT_CLINIC_OR_DEPARTMENT_OTHER)
Admission: EM | Admit: 2023-06-05 | Discharge: 2023-06-09 | DRG: 392 | Disposition: A | Discharge status: Home / Self Care | Attending: Internal Medicine | Admitting: Internal Medicine

## 2023-06-05 ENCOUNTER — Encounter (HOSPITAL_BASED_OUTPATIENT_CLINIC_OR_DEPARTMENT_OTHER): Payer: Self-pay

## 2023-06-05 ENCOUNTER — Emergency Department (HOSPITAL_BASED_OUTPATIENT_CLINIC_OR_DEPARTMENT_OTHER)

## 2023-06-05 ENCOUNTER — Other Ambulatory Visit: Payer: Self-pay

## 2023-06-05 DIAGNOSIS — Z860100 Personal history of colon polyps, unspecified: Secondary | ICD-10-CM | POA: Diagnosis not present

## 2023-06-05 DIAGNOSIS — R634 Abnormal weight loss: Secondary | ICD-10-CM | POA: Diagnosis present

## 2023-06-05 DIAGNOSIS — E66811 Obesity, class 1: Secondary | ICD-10-CM | POA: Diagnosis present

## 2023-06-05 DIAGNOSIS — Z792 Long term (current) use of antibiotics: Secondary | ICD-10-CM | POA: Diagnosis not present

## 2023-06-05 DIAGNOSIS — K5900 Constipation, unspecified: Secondary | ICD-10-CM | POA: Diagnosis present

## 2023-06-05 DIAGNOSIS — R933 Abnormal findings on diagnostic imaging of other parts of digestive tract: Secondary | ICD-10-CM

## 2023-06-05 DIAGNOSIS — K648 Other hemorrhoids: Secondary | ICD-10-CM | POA: Diagnosis present

## 2023-06-05 DIAGNOSIS — R197 Diarrhea, unspecified: Secondary | ICD-10-CM | POA: Diagnosis not present

## 2023-06-05 DIAGNOSIS — K2100 Gastro-esophageal reflux disease with esophagitis, without bleeding: Secondary | ICD-10-CM | POA: Diagnosis present

## 2023-06-05 DIAGNOSIS — K6389 Other specified diseases of intestine: Secondary | ICD-10-CM | POA: Diagnosis not present

## 2023-06-05 DIAGNOSIS — R9341 Abnormal radiologic findings on diagnostic imaging of renal pelvis, ureter, or bladder: Secondary | ICD-10-CM | POA: Diagnosis not present

## 2023-06-05 DIAGNOSIS — R109 Unspecified abdominal pain: Secondary | ICD-10-CM | POA: Diagnosis not present

## 2023-06-05 DIAGNOSIS — A09 Infectious gastroenteritis and colitis, unspecified: Principal | ICD-10-CM | POA: Diagnosis present

## 2023-06-05 DIAGNOSIS — E876 Hypokalemia: Secondary | ICD-10-CM | POA: Diagnosis present

## 2023-06-05 DIAGNOSIS — Z79899 Other long term (current) drug therapy: Secondary | ICD-10-CM

## 2023-06-05 DIAGNOSIS — K3189 Other diseases of stomach and duodenum: Secondary | ICD-10-CM | POA: Diagnosis present

## 2023-06-05 DIAGNOSIS — Z87891 Personal history of nicotine dependence: Secondary | ICD-10-CM | POA: Diagnosis not present

## 2023-06-05 DIAGNOSIS — J4520 Mild intermittent asthma, uncomplicated: Secondary | ICD-10-CM | POA: Diagnosis present

## 2023-06-05 DIAGNOSIS — Z6833 Body mass index (BMI) 33.0-33.9, adult: Secondary | ICD-10-CM

## 2023-06-05 DIAGNOSIS — K529 Noninfective gastroenteritis and colitis, unspecified: Secondary | ICD-10-CM | POA: Diagnosis present

## 2023-06-05 DIAGNOSIS — K760 Fatty (change of) liver, not elsewhere classified: Secondary | ICD-10-CM | POA: Diagnosis present

## 2023-06-05 DIAGNOSIS — D128 Benign neoplasm of rectum: Secondary | ICD-10-CM | POA: Diagnosis not present

## 2023-06-05 DIAGNOSIS — Z888 Allergy status to other drugs, medicaments and biological substances status: Secondary | ICD-10-CM

## 2023-06-05 DIAGNOSIS — K635 Polyp of colon: Secondary | ICD-10-CM | POA: Diagnosis not present

## 2023-06-05 DIAGNOSIS — R1114 Bilious vomiting: Secondary | ICD-10-CM

## 2023-06-05 DIAGNOSIS — I10 Essential (primary) hypertension: Secondary | ICD-10-CM | POA: Diagnosis present

## 2023-06-05 DIAGNOSIS — G4730 Sleep apnea, unspecified: Secondary | ICD-10-CM | POA: Diagnosis present

## 2023-06-05 DIAGNOSIS — E871 Hypo-osmolality and hyponatremia: Secondary | ICD-10-CM | POA: Diagnosis present

## 2023-06-05 DIAGNOSIS — R7989 Other specified abnormal findings of blood chemistry: Secondary | ICD-10-CM | POA: Diagnosis present

## 2023-06-05 DIAGNOSIS — G4733 Obstructive sleep apnea (adult) (pediatric): Secondary | ICD-10-CM | POA: Diagnosis present

## 2023-06-05 DIAGNOSIS — Z860101 Personal history of adenomatous and serrated colon polyps: Secondary | ICD-10-CM | POA: Diagnosis not present

## 2023-06-05 DIAGNOSIS — Z7952 Long term (current) use of systemic steroids: Secondary | ICD-10-CM | POA: Diagnosis not present

## 2023-06-05 DIAGNOSIS — Z604 Social exclusion and rejection: Secondary | ICD-10-CM | POA: Diagnosis present

## 2023-06-05 DIAGNOSIS — R1084 Generalized abdominal pain: Secondary | ICD-10-CM | POA: Diagnosis not present

## 2023-06-05 DIAGNOSIS — E785 Hyperlipidemia, unspecified: Secondary | ICD-10-CM | POA: Diagnosis present

## 2023-06-05 DIAGNOSIS — K50119 Crohn's disease of large intestine with unspecified complications: Secondary | ICD-10-CM | POA: Diagnosis present

## 2023-06-05 DIAGNOSIS — J45909 Unspecified asthma, uncomplicated: Secondary | ICD-10-CM | POA: Diagnosis not present

## 2023-06-05 LAB — URINALYSIS, ROUTINE W REFLEX MICROSCOPIC
Bacteria, UA: NONE SEEN
Glucose, UA: NEGATIVE mg/dL
Hgb urine dipstick: NEGATIVE
Ketones, ur: >80 mg/dL — AB
Leukocytes,Ua: NEGATIVE
Nitrite: NEGATIVE
Protein, ur: 30 mg/dL — AB
Specific Gravity, Urine: >1.046 — ABNORMAL HIGH (ref 1.005–1.030)
pH: 7 (ref 5.0–8.0)

## 2023-06-05 LAB — COMPREHENSIVE METABOLIC PANEL WITH GFR
ALT: 43 U/L (ref 0–44)
AST: 57 U/L — ABNORMAL HIGH (ref 15–41)
Albumin: 4 g/dL (ref 3.5–5.0)
Alkaline Phosphatase: 55 U/L (ref 38–126)
Anion gap: 12 (ref 5–15)
BUN: 11 mg/dL (ref 6–20)
CO2: 27 mmol/L (ref 22–32)
Calcium: 9 mg/dL (ref 8.9–10.3)
Chloride: 92 mmol/L — ABNORMAL LOW (ref 98–111)
Creatinine, Ser: 0.93 mg/dL (ref 0.61–1.24)
GFR, Estimated: >60 mL/min
Glucose, Bld: 95 mg/dL (ref 70–99)
Potassium: 3.4 mmol/L — ABNORMAL LOW (ref 3.5–5.1)
Sodium: 131 mmol/L — ABNORMAL LOW (ref 135–145)
Total Bilirubin: 1.6 mg/dL — ABNORMAL HIGH (ref 0.0–1.2)
Total Protein: 6.3 g/dL — ABNORMAL LOW (ref 6.5–8.1)

## 2023-06-05 LAB — CBC
HCT: 44.6 % (ref 39.0–52.0)
Hemoglobin: 15.8 g/dL (ref 13.0–17.0)
MCH: 33.3 pg (ref 26.0–34.0)
MCHC: 35.4 g/dL (ref 30.0–36.0)
MCV: 93.9 fL (ref 80.0–100.0)
Platelets: 192 10*3/uL (ref 150–400)
RBC: 4.75 MIL/uL (ref 4.22–5.81)
RDW: 13.7 % (ref 11.5–15.5)
WBC: 6.4 10*3/uL (ref 4.0–10.5)
nRBC: 0 % (ref 0.0–0.2)

## 2023-06-05 LAB — LIPASE, BLOOD: Lipase: 32 U/L (ref 11–51)

## 2023-06-05 LAB — C-REACTIVE PROTEIN: CRP: 1.6 mg/dL — ABNORMAL HIGH (ref <1.0)

## 2023-06-05 LAB — SEDIMENTATION RATE: Sed Rate: 1 mm/h (ref 0–16)

## 2023-06-05 MED ORDER — ONDANSETRON HCL 4 MG/2ML IJ SOLN
4.0000 mg | Freq: Once | INTRAMUSCULAR | Status: AC | PRN
  Administered 2023-06-05: 4 mg via INTRAVENOUS
  Filled 2023-06-05: qty 2

## 2023-06-05 MED ORDER — SODIUM CHLORIDE 0.9 % IV SOLN
2.0000 g | Freq: Once | INTRAVENOUS | Status: AC
  Administered 2023-06-05: 2 g via INTRAVENOUS
  Filled 2023-06-05: qty 20

## 2023-06-05 MED ORDER — ACETAMINOPHEN 325 MG PO TABS: 650.0000 mg | ORAL_TABLET | Freq: Four times a day (QID) | ORAL | Status: DC | PRN

## 2023-06-05 MED ORDER — METRONIDAZOLE 500 MG/100ML IV SOLN
500.0000 mg | Freq: Two times a day (BID) | INTRAVENOUS | Status: DC
Start: 2023-06-05 — End: 2023-06-09
  Administered 2023-06-05 – 2023-06-09 (×8): 500 mg via INTRAVENOUS
  Filled 2023-06-05 (×8): qty 100

## 2023-06-05 MED ORDER — IOHEXOL 300 MG/ML  SOLN
100.0000 mL | Freq: Once | INTRAMUSCULAR | Status: AC | PRN
  Administered 2023-06-05: 100 mL via INTRAVENOUS

## 2023-06-05 MED ORDER — ONDANSETRON HCL 4 MG PO TABS
4.0000 mg | ORAL_TABLET | Freq: Four times a day (QID) | ORAL | Status: DC | PRN
  Filled 2023-06-05: qty 1

## 2023-06-05 MED ORDER — SODIUM CHLORIDE 0.9 % IV BOLUS
1000.0000 mL | Freq: Once | INTRAVENOUS | Status: AC
  Administered 2023-06-05: 1000 mL via INTRAVENOUS

## 2023-06-05 MED ORDER — ONDANSETRON HCL 4 MG/2ML IJ SOLN
4.0000 mg | Freq: Four times a day (QID) | INTRAMUSCULAR | Status: DC
  Administered 2023-06-05 – 2023-06-08 (×10): 4 mg via INTRAVENOUS
  Filled 2023-06-05 (×10): qty 2

## 2023-06-05 MED ORDER — POTASSIUM CHLORIDE IN NACL 20-0.9 MEQ/L-% IV SOLN
INTRAVENOUS | Status: DC
  Filled 2023-06-05 (×3): qty 1000

## 2023-06-05 MED ORDER — ONDANSETRON HCL 4 MG/2ML IJ SOLN
4.0000 mg | Freq: Four times a day (QID) | INTRAMUSCULAR | Status: DC | PRN
  Administered 2023-06-05 – 2023-06-09 (×4): 4 mg via INTRAVENOUS
  Filled 2023-06-05 (×3): qty 2

## 2023-06-05 MED ORDER — HYDROMORPHONE HCL 1 MG/ML IJ SOLN
1.0000 mg | Freq: Once | INTRAMUSCULAR | Status: AC
  Administered 2023-06-05: 1 mg via INTRAVENOUS
  Filled 2023-06-05: qty 1

## 2023-06-05 MED ORDER — ACETAMINOPHEN 650 MG RE SUPP: 650.0000 mg | Freq: Four times a day (QID) | RECTAL | Status: DC | PRN

## 2023-06-05 MED ORDER — ENOXAPARIN SODIUM 60 MG/0.6ML IJ SOSY
60.0000 mg | PREFILLED_SYRINGE | INTRAMUSCULAR | Status: DC
Start: 2023-06-05 — End: 2023-06-07
  Administered 2023-06-05 – 2023-06-06 (×2): 60 mg via SUBCUTANEOUS
  Filled 2023-06-05 (×2): qty 0.6

## 2023-06-05 MED ORDER — KETOROLAC TROMETHAMINE 30 MG/ML IJ SOLN
30.0000 mg | Freq: Once | INTRAMUSCULAR | Status: AC
  Administered 2023-06-05: 30 mg via INTRAVENOUS
  Filled 2023-06-05: qty 1

## 2023-06-05 MED ORDER — SODIUM CHLORIDE 0.9 % IV SOLN
2.0000 g | INTRAVENOUS | Status: DC
  Administered 2023-06-06 – 2023-06-09 (×4): 2 g via INTRAVENOUS
  Filled 2023-06-05 (×4): qty 20

## 2023-06-05 MED ORDER — METRONIDAZOLE 500 MG/100ML IV SOLN
500.0000 mg | Freq: Once | INTRAVENOUS | Status: AC
  Administered 2023-06-05: 500 mg via INTRAVENOUS
  Filled 2023-06-05: qty 100

## 2023-06-05 MED ORDER — HYDROMORPHONE HCL 1 MG/ML IJ SOLN
1.0000 mg | INTRAMUSCULAR | Status: DC | PRN
  Administered 2023-06-05 (×4): 1 mg via INTRAVENOUS
  Filled 2023-06-05 (×4): qty 1

## 2023-06-05 MED ORDER — PANTOPRAZOLE SODIUM 40 MG PO TBEC
40.0000 mg | DELAYED_RELEASE_TABLET | Freq: Two times a day (BID) | ORAL | Status: DC
  Administered 2023-06-05 – 2023-06-09 (×8): 40 mg via ORAL
  Filled 2023-06-05 (×8): qty 1

## 2023-06-05 NOTE — ED Triage Notes (Addendum)
 Was seen on 06/01/23 for abd pain and n/v Dx with Crohn's dx was started on steroids and Cipro  and flagyl    Saw his GI MD 06/03/23 and they told him to stop steroids and continue the flagyl  and Cipro  they do not feel it is Crohn's States he can not keep anything down

## 2023-06-05 NOTE — Plan of Care (Signed)

## 2023-06-05 NOTE — Progress Notes (Signed)
 Plan of Care Note for accepted transfer   Patient: Jordan Smith MRN: [id number]   DOA: 06/05/2023  Facility [facility] transfer: [facility] Requesting Provider: Dr. Adan Holms.  Reason for transfer: Enterocolitis.  Facility course: See EDP notes. Plan of care: The patient is accepted for admission to Med-surg  unit, at [facility]. Discussed by EDP with Dr. Brice Campi. No steroids yet. Started on antibiotics. Please notify [facility] when the patient arrives to the hospital.  Author: Danice Dural, MD 06/05/2023  Check [email or web address] for on-call coverage.  Nursing staff, Please call [facility] Admits [facility] Consults [facility]-Wide number on Amion as soon as patient's arrival, so appropriate admitting provider can evaluate the pt.

## 2023-06-05 NOTE — TOC Initial Note (Signed)
 Transition of Care [facility]) - Initial/Assessment Note    Patient Details  Name: Jordan Smith MRN: [id number] Date of Birth: [birth year]  Transition of Care [facility]) CM/SW Contact:    Kathryn Parish, RN Phone Number: 06/05/2023, 4:17 PM  Clinical Narrative:                 Presented for abdominal pain with nausea and vomiting. PTA independent, drives to appointments; verified PCP and insurance; No DME, HH or oxygen; transportation at discharge will be wife via private vehicle. No TOC needs identified.  Expected Discharge Plan: Home/Self Care Barriers to Discharge: No Barriers Identified   Patient Goals and CMS Choice Patient states their goals for this hospitalization and ongoing recovery are:: return home   Choice offered to / list presented to : NA      Expected Discharge Plan and Services   Discharge Planning Services: NA Post Acute Care Choice: NA Living arrangements for the past 2 months: Single Family Home                 DME Arranged: N/A DME Agency: NA       HH Arranged: NA HH Agency: NA        Prior Living Arrangements/Services Living arrangements for the past 2 months: Single Family Home Lives with:: Spouse Patient language and need for interpreter reviewed:: Yes Do you feel safe going back to the place where you live?: Yes      Need for Family Participation in Patient Care: No (Comment) Care giver support system in place?: Yes (comment)   Criminal Activity/Legal Involvement Pertinent to Current Situation/Hospitalization: No - Comment as needed  Activities of Daily Living      Permission Sought/Granted Permission sought to share information with : Case Manager Permission granted to share information with : Yes, Verbal Permission Granted              Emotional Assessment Appearance:: Appears stated age Attitude/Demeanor/Rapport: Engaged Affect (typically observed): Accepting, Appropriate Orientation: : Oriented to Self, Oriented to Place,  Oriented to  Time, Oriented to Situation Alcohol / Substance Use: Not Applicable Psych Involvement: No (comment)  Admission diagnosis:  Enterocolitis [K52.9] Patient Active Problem List   Diagnosis Date Noted   Enterocolitis 06/05/2023   Hepatic steatosis 04/13/2019   Mild intermittent asthma without complication 04/13/2019   Class 2 obesity due to excess calories with body mass index (BMI) of 35.0 to 35.9 in adult 04/13/2019   Unilateral inguinal hernia without obstruction or gangrene 03/22/2019   Reactive airway disease 03/22/2019   Erectile dysfunction due to arterial insufficiency 03/22/2019   Healthcare maintenance 03/22/2019   Elevated LFTs 03/22/2019   Close exposure to COVID-19 virus 03/22/2019   Elevated LDL cholesterol level 03/22/2019   PCP:  Maryln Sober, PA-C Pharmacy:   [facility] [phone] Jonette Nestle, [place] - [address] [place] [address] Phone: [phone] Fax: [phone]  [facility] [address] [place] - [address] [place] [address] Phone: [phone] Fax: [phone]     Social Drivers of Health (SDOH) Social History: SDOH Screenings   Food Insecurity: No Food Insecurity (06/05/2023)  Housing: Unknown (06/05/2023)  Transportation Needs: No Transportation Needs (06/05/2023)  Utilities: Not At Risk (06/05/2023)  Depression (PHQ2-9): Low Risk  (03/22/2019)  Financial Resource Strain: Low Risk  (03/17/2023)   Received from [facility]  Physical Activity: Sufficiently Active (01/12/2023)   Received from [facility]  Social Connections: Somewhat Isolated (01/12/2023)   Received  from [facility]  Stress: Stress Concern Present (01/12/2023)   Received from [facility]  Tobacco Use: Medium Risk (06/05/2023)   SDOH Interventions:     Readmission Risk Interventions     No data to display

## 2023-06-05 NOTE — Consult Note (Signed)
 Gastroenterology Inpatient Consultation   Attending Requesting Consult Celinda Alm Lot, MD  [facility] Day: 1  Reason for Consult Abdominal pain and abnormal imaging    History of Present Illness  Jordan Smith is a 48 y.o. male with a pmh significant for hypertension, hyperlipidemia, OSA, hepatic steatosis, colon polyps (previous TA's).  The GI service is consulted for evaluation and management of abdominal pain and abnormal imaging concerning for enterocolitis versus potential IBD.  The patient has had progressive GI symptoms over the course the last 6 weeks.  Eventually was seen in clinic underwent an endoscopy for progressive acid indigestion with nausea and vomiting.  Found to have grade C esophagitis.  Treated with PPI and Carafate .  Still had some episodes of vomiting and nausea but starting 1 week ago he had development of nausea and vomiting and diarrhea that was severe with progressive lower and then generalized abdominal pain.  Had diarrhea through Sunday and no bowel movements since Sunday other than small amounts.  No blood has been noted other than with wiping.  He has never had symptoms like this previously.  His significant other has history of Crohn's and may be having a mild flare.  Patient's significant other daughter has longer standing GI issues and may have some issue or infection currently.  No fevers or chills.  Called the office and was given antibiotics which she took for 2 days but with progressive pain came in for further evaluation now twice during the last week.  Progressive changes of the right colon and terminal ileum suggestive of enterocolitis.  Last colonoscopy in 2024.  GI Review of Systems Positive as above Negative for dysphagia, odynophagia   Review of Systems  General: Positive for unintentional weight loss due to the nausea and vomiting; denies fevers/chills Cardiovascular: Denies chest pain Pulmonary: Denies shortness of  breath Gastroenterological: See HPI Genitourinary: Denies darkened urine Hematological: Denies easy bruising/bleeding Endocrine: Denies temperature intolerance Dermatological: Denies jaundice Psychological: Mood is anxious to get better   Histories  Past Medical History Past Medical History:  Diagnosis Date   Hyperlipidemia    Hypertension    Polycythemia    gives blood evey 2 weeks   Sleep apnea    c pap   Past Surgical History:  Procedure Laterality Date   BIOPSY  05/08/2022   Procedure: BIOPSY;  Surgeon: Wilhelmenia Aloha Raddle., MD;  Location: [facility];  Service: Gastroenterology;;   ORIN MEDIATE RELEASE Right 2010   COLONOSCOPY WITH PROPOFOL  N/A 05/08/2022   Procedure: COLONOSCOPY WITH PROPOFOL ;  Surgeon: Wilhelmenia Aloha Raddle., MD;  Location: [facility];  Service: Gastroenterology;  Laterality: N/A;   ENDOSCOPIC MUCOSAL RESECTION N/A 05/08/2022   Procedure: ENDOSCOPIC MUCOSAL RESECTION;  Surgeon: Wilhelmenia Aloha Raddle., MD;  Location: [facility];  Service: Gastroenterology;  Laterality: N/A;   HERNIA REPAIR Bilateral    POLYPECTOMY  05/08/2022   Procedure: POLYPECTOMY;  Surgeon: Mansouraty, Aloha Raddle., MD;  Location: [facility];  Service: Gastroenterology;;    Allergies Allergies  Allergen Reactions   Meperidine Anaphylaxis    Other Reaction(s): Other (See Comments) Happened as a child, pt does not remember reaction.      Family History Family History  Problem Relation Age of Onset   Aneurysm Mother    Hypothyroidism Mother    Vitamin D deficiency Sister    Cancer Maternal Aunt    Cancer Maternal Uncle    Glaucoma Maternal Grandfather    Colon cancer Neg Hx    Colon polyps Neg  Hx    Crohn's disease Neg Hx    Esophageal cancer Neg Hx    Rectal cancer Neg Hx    Stomach cancer Neg Hx    Ulcerative colitis Neg Hx    The patient's FH is negative for IBD/IBS/Liver Disease/GI Malignancies.  Social History Social History   Socioeconomic History    Marital status: Married    Spouse name: Not on file   Number of children: Not on file   Years of education: Not on file   Highest education level: Not on file  Occupational History   Not on file  Tobacco Use   Smoking status: Former    Current packs/day: 0.00    Average packs/day: 1 pack/day for 20.0 years (20.0 ttl pk-yrs)    Types: Cigarettes    Start date: 62    Quit date: 2014    Years since quitting: 11.3    Passive exposure: Never   Smokeless tobacco: Former  Building Services Engineer status: Former  Substance and Sexual Activity   Alcohol use: Yes    Alcohol/week: 1.0 - 2.0 standard drink of alcohol    Types: 1 - 2 Standard drinks or equivalent per week    Comment: social   Drug use: Never   Sexual activity: Yes  Other Topics Concern   Not on file  Social History Narrative   Not on file   Social Drivers of Health   Financial Resource Strain: Low Risk  (03/17/2023)   Received from [facility]   Overall Financial Resource Strain (CARDIA)    Difficulty of Paying Living Expenses: Not hard at all  Food Insecurity: No Food Insecurity (03/17/2023)   Received from [facility]   Hunger Vital Sign    Worried About Running Out of Food in the Last Year: Never true    Ran Out of Food in the Last Year: Never true  Transportation Needs: No Transportation Needs (03/17/2023)   Received from [facility] - Transportation    Lack of Transportation (Medical): No    Lack of Transportation (Non-Medical): No  Physical Activity: Sufficiently Active (01/12/2023)   Received from [facility]   Exercise Vital Sign    Days of Exercise per Week: 4 days    Minutes of Exercise per Session: 90 min  Stress: Stress Concern Present (01/12/2023)   Received from [facility] - Occupational Stress Questionnaire    Feeling of Stress : Rather much  Social Connections: Somewhat Isolated (01/12/2023)   Received from [facility]   Social  Network    How would you rate your social network (family, work, friends)?: Restricted participation with some degree of social isolation  Intimate Partner Violence: Not At Risk (01/12/2023)   Received from [facility]   HITS    Over the last 12 months how often did your partner physically hurt you?: Never    Over the last 12 months how often did your partner insult you or talk down to you?: Sometimes    Over the last 12 months how often did your partner threaten you with physical harm?: Never    Over the last 12 months how often did your partner scream or curse at you?: Sometimes    Medications  Home Medications No current facility-administered medications on file prior to encounter.   Current Outpatient Medications on File Prior to Encounter  Medication Sig Dispense Refill   anastrozole (ARIMIDEX) 1 MG tablet Take 1  mg by mouth every Tuesday.     CARAFATE  1 g tablet Take sucralfate  tablets 1 gram PO QID for 2 weeks. 120 tablet 0   ciprofloxacin  (CIPRO ) 500 MG tablet Take 1 tablet (500 mg total) by mouth 2 (two) times daily for 7 days. 14 tablet 0   meloxicam (MOBIC) 15 MG tablet Take 15 mg by mouth daily. (Patient not taking: Reported on 05/01/2023)     metroNIDAZOLE  (FLAGYL ) 500 MG tablet Take 1 tablet (500 mg total) by mouth 3 (three) times daily for 7 days. 21 tablet 0   ondansetron  (ZOFRAN -ODT) 4 MG disintegrating tablet Take 1 tablet (4 mg total) by mouth every 8 (eight) hours as needed for nausea or vomiting. 20 tablet 0   pantoprazole  (PROTONIX ) 40 MG tablet Take 1 tablet (40 mg total) by mouth 2 (two) times daily for 60 days, THEN 1 tablet (40 mg total) daily. Take Protonix  (pantoprazole ) 40 mg PO BID for 8 weeks, then reduce to 40 mg daily if upper GI. 180 tablet 0   predniSONE  (STERAPRED UNI-PAK 21 TAB) 10 MG (21) TBPK tablet Take by mouth daily. Take 6 tabs by mouth daily  for 2 days, then 5 tabs for 2 days, then 4 tabs for 2 days, then 3 tabs for 2 days, 2 tabs for 2 days, then  1 tab by mouth daily for 2 days 42 tablet 0   tadalafil (CIALIS) 10 MG tablet Take 10 mg by mouth daily as needed for erectile dysfunction.     testosterone cypionate (DEPOTESTOSTERONE CYPIONATE) 200 MG/ML injection SMARTSIG:0.5 Milliliter(s) IM Twice a Week     triamcinolone ointment (KENALOG) 0.5 % Apply 1 Application topically at bedtime. Eye lid (Patient not taking: Reported on 05/01/2023)     Scheduled Inpatient Medications  Continuous Inpatient Infusions  PRN Inpatient Medications    Physical Examination  BP (!) 151/83   Pulse 79   Temp 98.6 F (37 C) (Oral)   Resp 18   Ht 6' (1.829 m)   Wt 113 kg   SpO2 100%   BMI 33.79 kg/m  GEN: NAD, appears stated age, doesn't appear chronically ill, significant other at bedside PSYCH: Cooperative, without pressured speech EYE: Conjunctivae pink, sclerae anicteric ENT: MMM CV: Nontachycardic RESP: No audible wheezing GI: NABS, soft, protuberant abdomen, rounded, TTP generalized throughout the abdomen with some increased allodynia, volitional guarding present, no rebound MSK/EXT: No significant lower extremity edema SKIN: No jaundice, no spider angiomata NEURO:  Alert & Oriented x 3, no focal deficits   Review of Data  I reviewed the following data at the time of this encounter:  Laboratory Studies   Recent Labs  Lab 06/03/23 1421 06/05/23 0411  NA 137 131*  K 3.9 3.4*  CL 93* 92*  CO2 32 27  BUN 9 11  CREATININE 1.01 0.93  GLUCOSE 107* 95  CALCIUM 9.8 9.0  MG 1.6  --    Recent Labs  Lab 06/05/23 0411  AST 57*  ALT 43  ALKPHOS 55    Recent Labs  Lab 06/01/23 1910 06/03/23 1421 06/05/23 0411  WBC 8.3 7.2 6.4  HGB 17.4* 17.2* 15.8  HCT 51.1 51.5 44.6  PLT 254 245.0 192   No results for input(s): APTT, INR in the last 168 hours.  Imaging Studies  4/18 CTAP with contrast IMPRESSION: 1. Ongoing Enterocolitis, appearance not significantly changed from CT four days ago. Increased simple fluid density  ascites since that time. No evidence of bowel obstruction or perforation. 2.  Diminutive urinary bladder appears inflamed. Consider superimposed Cystitis or UTI. 3. Hepatic steatosis. 4.  Aortic Atherosclerosis (ICD10-I70.0).  4/14 CTAP with contrast IMPRESSION: 1. Interval development of extensive submucosal edema, surrounding inflammatory stranding, and mesenteric edema involving the terminal ileum and ascending colon in keeping with infectious or inflammatory enterocolitis as can be seen with Crohn's enterocolitis. There are milder, new, regionally distinct inflammatory changes also identified within the descending and sigmoid colon. Interval development of mild ascites. No evidence of obstruction or perforation. 2. Marked hepatic steatosis. 3.  Aortic Atherosclerosis (ICD10-I70.0)  3/30 CTAP with contrast IMPRESSION: Layering gallstones versus gallbladder sludge, without associated inflammatory changes. Severe hepatic steatosis.  GI Procedures and Studies  4/25 EGD - LA Grade C esophagitis with no bleeding. Biopsied. - Normal upper third of esophagus and middle third of esophagus. - Gastroesophageal flap valve classified as Hill Grade III (minimal fold, loose to endoscope, hiatal hernia likely). - Gastritis. Biopsied. - Normal examined duodenum  Pathology FINAL DIAGNOSIS       1. Surgical [P], gastric random :       REACTIVE GASTROPATHY       NEGATIVE FOR H. PYLORI, INTESTINAL METAPLASIA, DYSPLASIA AND CARCINOMA       2. Surgical [P], distal esophagus :       REACTIVE SQUAMOUS MUCOSA WITH CHANGES SUGGESTIVE OF REFLUX ESOPHAGITIS       NEGATIVE FOR GLANDULAR EPITHELIUM, EOSINOPHILS, DYSPLASIA AND CARCINOMA   3/24 colonoscopy - Hemorrhoids found on digital rectal exam. - Post-polypectomy scar in the mid ascending colon between 2 previously placed tattoos. The scar looks relatively benign with just a very minor polypoid appearing area in the central portion of the scar. Tissue  removed via snare and forcep avulsion. - Two 2 to 3 mm polyps in the rectum, removed with a cold snare. Resected and retrieved. - Normal mucosa in the entire examined colon otherwise. - Non-bleeding non-thrombosed internal hemorrhoids.   11/26 3 colonoscopy - One 25 mm polyp in the ascending colon, removed with a cold snare. Incomplete resection. Resected tissue retrieved. Clips (MR conditional) were placed. Tattooed. - One 20 mm polyp in the ascending colon, removed with a hot snare. Resected and retrieved. Clip (MR conditional) was placed. - One 8 mm polyp in the descending colon, removed with a cold snare. Resected and retrieved. - Two 2 to 5 mm polyps in the rectum, removed with a cold snare. Resected and retrieved. - Non-bleeding internal hemorrhoids. - The examined portion of the ileum was normal.   Assessment  Jordan Smith is a 48 y.o. male with a pmh significant for hypertension, hyperlipidemia, OSA, hepatic steatosis, colon polyps (previous TA's).  The GI service is consulted for evaluation and management of abdominal pain and abnormal imaging concerning for enterocolitis versus potential IBD.  The patient is hemodynamically stable.  Clinically, etiology and symptomatology sounds most likely that this is an infectious enterocolitis.  Will continue antibiotic therapy for now.  If he does not have continued improvement and/or his stool culture data is negative (or he cannot give it), colonoscopic evaluation of the area does potentially need to be pursued.  We discussed that we will make some decisions in the next 24 to 36 hours.  Colonoscopy may be pursued during inpatient stay should he not have further improvement.  If he does have improvement with IV antibiotics then could consider outpatient updated colonoscopy after imaging.  Time will tell.  Will need to see how he is doing tomorrow to determine timing of potential colonoscopy  if he is progressing or worsening.  Continuing his PPI therapy in the  setting of his recent grade C esophagitis, makes sense; will not restart Carafate .  Although he has evidence of gallstones on prior imaging, this does not sound like gallbladder disease at this time.  All patient questions were answered to the best of my ability, and the patient agrees to the aforementioned plan of action with follow-up as indicated.   Plan/Recommendations  Follow-up GI pathogen panel Follow-up C. difficile Agree with antibiotic therapy for potential infectious enterocolitis  -There had been some outpatient clinical concern previously about Flagyl  use and mental status change but can monitor while in-house - Could consider Zosyn if issues arise Pending overall clinical status, will consider role of colonoscopic evaluation Continue PPI twice daily Schedule Zofran  4 mg every 6 hours As needed Zofran  available if needed (not to give more than 24 mg in a day   Thank you for this consult.  We will continue to follow.  Please page/call with questions or concerns.   Aloha Finner, MD  Gastroenterology Advanced Endoscopy Office # [phone]

## 2023-06-05 NOTE — ED Provider Notes (Signed)
 [place] EMERGENCY DEPARTMENT AT [facility] Provider Note   CSN: [id number] Arrival date & time: 06/05/23  4098     History  Chief Complaint  Patient presents with   Abdominal Pain   Emesis    Jordan Smith is a 48 y.o. male.  Patient returns with nausea, vomiting, abdominal pain.  He was seen at [facility] emergency department 4 days ago after he developed severe abdominal pain, nausea, vomiting and diarrhea.  A CAT scan showed diffuse inflammation.  He was started on prednisone  for possible inflammatory bowel disease.  He does not have a history of IBD.  He contacted his gastroenterologist who reviewed the scans and thought it was more of an enterocolitis, instructed the patient to stop the prednisone  and he is on Cipro  and Flagyl .       Home Medications Prior to Admission medications   Medication Sig Start Date End Date Taking? Authorizing Provider  anastrozole (ARIMIDEX) 1 MG tablet Take 1 mg by mouth every Tuesday. 06/10/21   [provider]  CARAFATE  1 g tablet Take sucralfate  tablets 1 gram PO QID for 2 weeks. 05/20/23   Cirigliano, Vito V, DO  ciprofloxacin  (CIPRO ) 500 MG tablet Take 1 tablet (500 mg total) by mouth 2 (two) times daily for 7 days. 06/02/23 06/09/23  May, Deanna J, NP  meloxicam (MOBIC) 15 MG tablet Take 15 mg by mouth daily. Patient not taking: Reported on 05/01/2023    [provider]  metroNIDAZOLE  (FLAGYL ) 500 MG tablet Take 1 tablet (500 mg total) by mouth 3 (three) times daily for 7 days. 06/02/23 06/09/23  May, Deanna J, NP  ondansetron  (ZOFRAN -ODT) 4 MG disintegrating tablet Take 1 tablet (4 mg total) by mouth every 8 (eight) hours as needed for nausea or vomiting. 06/02/23   Royann Cords, PA  pantoprazole  (PROTONIX ) 40 MG tablet Take 1 tablet (40 mg total) by mouth 2 (two) times daily for 60 days, THEN 1 tablet (40 mg total) daily. Take Protonix  (pantoprazole ) 40 mg PO BID for 8 weeks, then reduce to 40 mg daily if upper GI.  05/20/23 09/17/23  Cirigliano, Vito V, DO  predniSONE  (STERAPRED UNI-PAK 21 TAB) 10 MG (21) TBPK tablet Take by mouth daily. Take 6 tabs by mouth daily  for 2 days, then 5 tabs for 2 days, then 4 tabs for 2 days, then 3 tabs for 2 days, 2 tabs for 2 days, then 1 tab by mouth daily for 2 days 06/02/23   Royann Cords, PA  tadalafil (CIALIS) 10 MG tablet Take 10 mg by mouth daily as needed for erectile dysfunction. 12/05/21   [provider]  testosterone cypionate (DEPOTESTOSTERONE CYPIONATE) 200 MG/ML injection SMARTSIG:0.5 Milliliter(s) IM Twice a Week 02/03/23   [provider]  triamcinolone ointment (KENALOG) 0.5 % Apply 1 Application topically at bedtime. Eye lid Patient not taking: Reported on 05/01/2023 02/14/22   [provider]      Allergies    Meperidine    Review of Systems   Review of Systems  Physical Exam Updated Vital Signs BP (!) 147/84   Pulse 74   Temp 97.8 F (36.6 C) (Oral)   Resp 18   Ht 6' (1.829 m)   Wt 113 kg   SpO2 98%   BMI 33.79 kg/m  Physical Exam Vitals and nursing note reviewed.  Constitutional:      General: He is not in acute distress.    Appearance: He is well-developed.  HENT:  Head: Normocephalic and atraumatic.     Mouth/Throat:     Mouth: Mucous membranes are moist.  Eyes:     General: Vision grossly intact. Gaze aligned appropriately.     Extraocular Movements: Extraocular movements intact.     Conjunctiva/sclera: Conjunctivae normal.  Cardiovascular:     Rate and Rhythm: Normal rate and regular rhythm.     Pulses: Normal pulses.     Heart sounds: Normal heart sounds, S1 normal and S2 normal. No murmur heard.    No friction rub. No gallop.  Pulmonary:     Effort: Pulmonary effort is normal. No respiratory distress.     Breath sounds: Normal breath sounds.  Abdominal:     Palpations: Abdomen is soft.     Tenderness: There is generalized abdominal tenderness. There is no guarding or rebound.     Hernia:  No hernia is present.  Musculoskeletal:        General: No swelling.     Cervical back: Full passive range of motion without pain, normal range of motion and neck supple. No pain with movement, spinous process tenderness or muscular tenderness. Normal range of motion.     Right lower leg: No edema.     Left lower leg: No edema.  Skin:    General: Skin is warm and dry.     Capillary Refill: Capillary refill takes less than 2 seconds.     Findings: No ecchymosis, erythema, lesion or wound.  Neurological:     Mental Status: He is alert and oriented to person, place, and time.     GCS: GCS eye subscore is 4. GCS verbal subscore is 5. GCS motor subscore is 6.     Cranial Nerves: Cranial nerves 2-12 are intact.     Sensory: Sensation is intact.     Motor: Motor function is intact. No weakness or abnormal muscle tone.     Coordination: Coordination is intact.  Psychiatric:        Mood and Affect: Mood normal.        Speech: Speech normal.        Behavior: Behavior normal.     ED Results / Procedures / Treatments   Labs (all labs ordered are listed, but only abnormal results are displayed) Labs Reviewed  COMPREHENSIVE METABOLIC PANEL WITH GFR - Abnormal; Notable for the following components:      Result Value   Sodium 131 (*)    Potassium 3.4 (*)    Chloride 92 (*)    Total Protein 6.3 (*)    AST 57 (*)    Total Bilirubin 1.6 (*)    All other components within normal limits  LIPASE, BLOOD  CBC  URINALYSIS, ROUTINE W REFLEX MICROSCOPIC    EKG None  Radiology CT ABDOMEN PELVIS W CONTRAST Result Date: 06/05/2023 CLINICAL DATA:  48 year old male with abdominal pain for 2-3 weeks with nausea and vomiting. Recently diagnosed with Crohn disease. EXAM: CT ABDOMEN AND PELVIS WITH CONTRAST TECHNIQUE: Multidetector CT imaging of the abdomen and pelvis was performed using the standard protocol following bolus administration of intravenous contrast. RADIATION DOSE REDUCTION: This exam was  performed according to the departmental dose-optimization program which includes automated exposure control, adjustment of the mA and/or kV according to patient size and/or use of iterative reconstruction technique. CONTRAST:  OMNIPAQUE  IOHEXOL  300 MG/ML  SOLN COMPARISON:  Recent CTs Abdomen and Pelvis 06/01/2023 and earlier. FINDINGS: Lower chest: Stable and negative aside from minor lung base atelectasis. Hepatobiliary: Increased perihepatic  ascites with simple fluid density, small volume (series 2, image 20). Underlying hepatic steatosis and gallbladder appear stable. Pancreas: Negative. Spleen: Negative. Adrenals/Urinary Tract: Negative adrenal glands and kidneys. Diminutive urinary bladder with nonspecific wall thickening and mild perivesical inflammatory stranding on series 2, image 82. No urinary calculus. Stomach/Bowel: Fluid in the large bowel to the rectum. Nondilated large and small bowel loops, but inflamed, thickened distal small bowel in the right lower quadrant very similar to the CT Abdomen and Pelvis 4 days ago (coronal image 86). Mesenteric inflammation and small volume of mesenteric free fluid around the affected loops. Gradual transition to more normal appearing upstream small bowel which is mildly gas distended in the anterior central abdomen. Other jejunum loops are decompressed and within normal limits. Small volume of free fluid also at the root of the small bowel mesentery. Stomach and duodenum decompressed. No pneumoperitoneum. Vascular/Lymphatic: Aortoiliac calcified atherosclerosis. Normal caliber abdominal aorta. Major arterial structures are patent. Portal venous system is patent. No lymphadenopathy identified. Reproductive: Negative. Other: Low-density free fluid continues layering in the anterior lower peritoneal cavity. No free fluid in the deep pelvis. Musculoskeletal: Stable and negative. IMPRESSION: 1. Ongoing Enterocolitis, appearance not significantly changed from CT four  days ago. Increased simple fluid density ascites since that time. No evidence of bowel obstruction or perforation. 2. Diminutive urinary bladder appears inflamed. Consider superimposed Cystitis or UTI. 3. Hepatic steatosis. 4.  Aortic Atherosclerosis (ICD10-I70.0). Electronically Signed   By: Marlise Simpers M.D.   On: 06/05/2023 06:51    Procedures Procedures    Medications Ordered in ED Medications  ondansetron  (ZOFRAN ) injection 4 mg (4 mg Intravenous Given 06/05/23 0430)  sodium chloride  0.9 % bolus 1,000 mL (0 mLs Intravenous Stopped 06/05/23 0554)  ketorolac  (TORADOL ) 30 MG/ML injection 30 mg (30 mg Intravenous Given 06/05/23 0431)  iohexol  (OMNIPAQUE ) 300 MG/ML solution 100 mL (100 mLs Intravenous Contrast Given 06/05/23 0624)  sodium chloride  0.9 % bolus 1,000 mL (1,000 mLs Intravenous New Bag/Given 06/05/23 1914)    ED Course/ Medical Decision Making/ A&P Clinical Course as of 06/05/23 7829  Fri Jun 05, 2023  0651 Assumed care from Dr Carol Chroman. 48 yo M with hx of who presented with abdominal pain. Had ct 4 days ago that showed possible diffuse colitis. Concerned for possible crohn's was initially started on prednisone  that was dc'd. Returns with pain and n/v. Got repeat ct today.  [RP]    Clinical Course User Index [RP] Ninetta Basket, MD                                 Medical Decision Making Amount and/or Complexity of Data Reviewed Labs: ordered. Radiology: ordered.  Risk Prescription drug management.   Patient presents with persistent ongoing abdominal pain, nausea and vomiting.  Patient seen 3 days ago with similar symptoms and diagnosed with enterocolitis, possible inflammatory bowel disease.  He is on Cipro  and Flagyl , has taken it for 2 days and has not improved.  Blood work is not significantly changed.  CT scan repeated to evaluate for response to antibiotics as well as to rule out complications such as perforation and abscess formation.  CT is largely unchanged from  prior although he has developed some ascites which is likely reactive.  No sign of perforation.  Patient may benefit from admission for IV antibiotics and possible GI consult.        Final Clinical Impression(s) / ED Diagnoses Final  diagnoses:  Enterocolitis    Rx / DC Orders ED Discharge Orders     None         Lathaniel Legate, Marine Sia, MD 06/05/23 [phone]

## 2023-06-05 NOTE — ED Notes (Signed)
 Called [facility] for transport, pt bed assignment is ready

## 2023-06-05 NOTE — H&P (Signed)
 History and Physical    Patient: Jordan Smith WUJ:[id number] DOB: July 31, 1975 DOA: 06/05/2023 DOS: the patient was seen and examined on 06/05/2023 PCP: Maryln Sober, PA-C  Patient coming from: Home  Chief Complaint:  Chief Complaint  Patient presents with   Abdominal Pain   Emesis   HPI: Jordan Smith is a 48 y.o. male with medical history significant of hypertension, hyperlipidemia, polycythemia, sleep apnea on CPAP who presented to the emergency department with abdominal pain, dyspepsia, nausea and vomiting.  He has had multiple episodes of diarrhea, but seems to be constipated in recent days.  He has been having symptoms for about a month and a half.  Possible sick contacts at home with his wife and daughter having similar symptoms.  However, wife has a history of Crohn's. He denied fever, chills, rhinorrhea, sore throat, wheezing or hemoptysis.  No chest pain, palpitations, diaphoresis, PND, orthopnea or pitting edema of the lower extremities.   No flank pain, dysuria, frequency or hematuria.  No polyuria, polydipsia, polyphagia or blurred vision.   Lab work: Urinalysis specific gravity was greater than 1.046, there was small bilirubin, ketones greater than 80 and protein of 30 mg/dL.  The rest of the UA was normal.  CBC was normal.  CMP showed a sodium 131, potassium 3.4 and chloride 92 mmol/L.  The rest of the electrolytes and renal function was normal.  Total protein 6.3, AST 57 and total bilirubin 1.6.  Lipase 32 units/L.  Sed rate was normal.  Imaging: CT abdomen/pelvis ongoing enterocolitis which has not changed from the study done 4 days prior.  Increased simple fluid density ascites since that time.  No bowel obstruction or perforation.  Questionable cystitis or UTI.  Hepatic steatosis.  Aortic atherosclerosis.   ED course: Initial vital signs were temperature 97.8 F, pulse 98, respiration 18, BP 154/113 mmHg O2 sat 99% on room air.  The patient received ceftriaxone  2 g  IVPB, Triazole 500 mg IV, hydromorphone  1 mg IVP, ketorolac  30 mg IVP, ondansetron  4 mg IVP and 2000 mL of normal saline bolus.  Review of Systems: As mentioned in the history of present illness. All other systems reviewed and are negative. Past Medical History:  Diagnosis Date   Hyperlipidemia    Hypertension    Polycythemia    gives blood evey 2 weeks   Sleep apnea    c pap   Past Surgical History:  Procedure Laterality Date   BIOPSY  05/08/2022   Procedure: BIOPSY;  Surgeon: Brice Campi Albino Alu., MD;  Location: [facility];  Service: Gastroenterology;;   Ritta Chessman RELEASE Right 2010   COLONOSCOPY WITH PROPOFOL  N/A 05/08/2022   Procedure: COLONOSCOPY WITH PROPOFOL ;  Surgeon: Normie Becton., MD;  Location: [facility];  Service: Gastroenterology;  Laterality: N/A;   ENDOSCOPIC MUCOSAL RESECTION N/A 05/08/2022   Procedure: ENDOSCOPIC MUCOSAL RESECTION;  Surgeon: Brice Campi Albino Alu., MD;  Location: [facility];  Service: Gastroenterology;  Laterality: N/A;   HERNIA REPAIR Bilateral    POLYPECTOMY  05/08/2022   Procedure: POLYPECTOMY;  Surgeon: Mansouraty, Albino Alu., MD;  Location: [facility];  Service: Gastroenterology;;   Social History:  reports that he quit smoking about 11 years ago. His smoking use included cigarettes. He started smoking about 31 years ago. He has a 20 pack-year smoking history. He has never been exposed to tobacco smoke. He has quit using smokeless tobacco. He reports current alcohol use of about 1.0 - 2.0 standard drink of alcohol per week. He reports that he does not  use drugs.  Allergies  Allergen Reactions   Meperidine Anaphylaxis    Other Reaction(s): Other (See Comments) Happened as a child, pt does not remember reaction.      Family History  Problem Relation Age of Onset   Aneurysm Mother    Hypothyroidism Mother    Vitamin D deficiency Sister    Cancer Maternal Aunt    Cancer Maternal Uncle    Glaucoma Maternal Grandfather     Colon cancer Neg Hx    Colon polyps Neg Hx    Crohn's disease Neg Hx    Esophageal cancer Neg Hx    Rectal cancer Neg Hx    Stomach cancer Neg Hx    Ulcerative colitis Neg Hx     Prior to Admission medications   Medication Sig Start Date End Date Taking? Authorizing Provider  anastrozole (ARIMIDEX) 1 MG tablet Take 1 mg by mouth every Tuesday. 06/10/21   [provider]  CARAFATE  1 g tablet Take sucralfate  tablets 1 gram PO QID for 2 weeks. 05/20/23   Cirigliano, Vito V, DO  ciprofloxacin  (CIPRO ) 500 MG tablet Take 1 tablet (500 mg total) by mouth 2 (two) times daily for 7 days. 06/02/23 06/09/23  May, Deanna J, NP  meloxicam (MOBIC) 15 MG tablet Take 15 mg by mouth daily. Patient not taking: Reported on 05/01/2023    [provider]  metroNIDAZOLE  (FLAGYL ) 500 MG tablet Take 1 tablet (500 mg total) by mouth 3 (three) times daily for 7 days. 06/02/23 06/09/23  May, Deanna J, NP  ondansetron  (ZOFRAN -ODT) 4 MG disintegrating tablet Take 1 tablet (4 mg total) by mouth every 8 (eight) hours as needed for nausea or vomiting. 06/02/23   Royann Cords, PA  pantoprazole  (PROTONIX ) 40 MG tablet Take 1 tablet (40 mg total) by mouth 2 (two) times daily for 60 days, THEN 1 tablet (40 mg total) daily. Take Protonix  (pantoprazole ) 40 mg PO BID for 8 weeks, then reduce to 40 mg daily if upper GI. 05/20/23 09/17/23  Cirigliano, Laquetta Plank, DO  predniSONE  (STERAPRED UNI-PAK 21 TAB) 10 MG (21) TBPK tablet Take by mouth daily. Take 6 tabs by mouth daily  for 2 days, then 5 tabs for 2 days, then 4 tabs for 2 days, then 3 tabs for 2 days, 2 tabs for 2 days, then 1 tab by mouth daily for 2 days 06/02/23   Royann Cords, PA  tadalafil (CIALIS) 10 MG tablet Take 10 mg by mouth daily as needed for erectile dysfunction. 12/05/21   [provider]  testosterone cypionate (DEPOTESTOSTERONE CYPIONATE) 200 MG/ML injection SMARTSIG:0.5 Milliliter(s) IM Twice a Week 02/03/23   [provider]   triamcinolone ointment (KENALOG) 0.5 % Apply 1 Application topically at bedtime. Eye lid Patient not taking: Reported on 05/01/2023 02/14/22   [provider]    Physical Exam: Vitals:   06/05/23 0653 06/05/23 0700 06/05/23 0825 06/05/23 1033  BP: (!) 147/84 (!) 151/83  (!) 128/99  Pulse: 74 79  62  Resp: 18   16  Temp:   98.6 F (37 C) 98.2 F (36.8 C)  TempSrc:   Oral Oral  SpO2: 98% 100%  98%  Weight:      Height:       Physical Exam Vitals reviewed.  Constitutional:      General: He is awake. He is not in acute distress.    Appearance: He is obese. He is ill-appearing.  HENT:     Head: Normocephalic.  Nose: No rhinorrhea.     Mouth/Throat:     Mouth: Mucous membranes are moist.  Eyes:     General: No scleral icterus.    Pupils: Pupils are equal, round, and reactive to light.  Neck:     Vascular: No JVD.  Cardiovascular:     Rate and Rhythm: Normal rate and regular rhythm.     Heart sounds: S1 normal and S2 normal.  Pulmonary:     Effort: Pulmonary effort is normal.     Breath sounds: Normal breath sounds. No wheezing, rhonchi or rales.  Abdominal:     General: Abdomen is protuberant. Bowel sounds are normal. There is no distension.     Palpations: Abdomen is soft.     Tenderness: There is abdominal tenderness. There is no right CVA tenderness or left CVA tenderness.  Musculoskeletal:     Cervical back: Neck supple.     Right lower leg: No edema.     Left lower leg: No edema.  Skin:    General: Skin is warm and dry.  Neurological:     General: No focal deficit present.     Mental Status: He is alert and oriented to person, place, and time.  Psychiatric:        Mood and Affect: Mood normal.        Behavior: Behavior normal. Behavior is cooperative.     Data Reviewed:  Results are pending, will review when available.  Assessment and Plan: Principal Problem:   Enterocolitis Admit to medsurg/inpatient. Continue IV fluids. Continue  ceftriaxone  2 g every 24 hours. Continue metronidazole  500 mg IVPB q 12 hr. Awaiting for stool workup. Follow-up blood culture and sensitivity Follow CBC and CMP in a.m.  Active Problems:   Hypokalemia Due to GI losses. Replacing. Follow potassium level in AM.    Hyponatremia Replacing. Follow sodium level in AM.    Class 1 obesity Current BMI  kg/m. Would benefit from lifestyle modifications. Follow-up closely with PCP and/or bariatric clinic.    Elevated LFTs Check LFTs in AM.    Mild intermittent asthma without complication Bronchodilators as needed.    Advance Care Planning:   Code Status: Full Code   Consults:   Family Communication:   Severity of Illness: The appropriate patient status for this patient is INPATIENT. Inpatient status is judged to be reasonable and necessary in order to provide the required intensity of service to ensure the patient's safety. The patient's presenting symptoms, physical exam findings, and initial radiographic and laboratory data in the context of their chronic comorbidities is felt to place them at high risk for further clinical deterioration. Furthermore, it is not anticipated that the patient will be medically stable for discharge from the hospital within 2 midnights of admission.   * I certify that at the point of admission it is my clinical judgment that the patient will require inpatient hospital care spanning beyond 2 midnights from the point of admission due to high intensity of service, high risk for further deterioration and high frequency of surveillance required.*  Author: Danice Dural, MD 06/05/2023 10:45 AM  For on call review [email or web address].    This document was prepared using Dragon voice recognition software and may contain some unintended transcription errors.

## 2023-06-05 NOTE — ED Provider Notes (Signed)
  Physical Exam  BP (!) 147/84   Pulse 74   Temp 98.6 F (37 C) (Oral)   Resp 18   Ht 6' (1.829 m)   Wt 113 kg   SpO2 98%   BMI 33.79 kg/m   Physical Exam  Procedures  Procedures  ED Course / MDM   Clinical Course as of 06/05/23 0841  Fri Jun 05, 2023  3086 Assumed care from Dr Carol Chroman. 48 yo M who presented with abdominal pain. Had ct 4 days ago that showed possible diffuse colitis. Concerned for possible crohn's was initially started on prednisone  that was dc'd. Returns with pain and n/v. Got repeat ct today which appears to be largely unchanged. [RP]  0740 Dr Brice Campi from [facility] recommends getting a stool sample and sending inflammatory markers.  Does not feel that patient needs to be started on steroids at this time.  They will see the patient at [facility] or [facility] wherever he is admitted. [RP]  5784 Dr Bonita Bussing from hospitalist to admit.  [RP]    Clinical Course User Index [RP] Ninetta Basket, MD   Medical Decision Making Amount and/or Complexity of Data Reviewed Labs: ordered. Radiology: ordered.  Risk Prescription drug management. Decision regarding hospitalization.      Ninetta Basket, MD 06/05/23 [phone]

## 2023-06-06 DIAGNOSIS — R9341 Abnormal radiologic findings on diagnostic imaging of renal pelvis, ureter, or bladder: Secondary | ICD-10-CM | POA: Diagnosis not present

## 2023-06-06 DIAGNOSIS — K760 Fatty (change of) liver, not elsewhere classified: Secondary | ICD-10-CM

## 2023-06-06 DIAGNOSIS — K529 Noninfective gastroenteritis and colitis, unspecified: Secondary | ICD-10-CM | POA: Diagnosis not present

## 2023-06-06 DIAGNOSIS — K2100 Gastro-esophageal reflux disease with esophagitis, without bleeding: Secondary | ICD-10-CM

## 2023-06-06 LAB — PROTIME-INR
INR: 1.1 (ref 0.8–1.2)
Prothrombin Time: 14 s (ref 11.4–15.2)

## 2023-06-06 LAB — CBC
HCT: 45 % (ref 39.0–52.0)
Hemoglobin: 15.2 g/dL (ref 13.0–17.0)
MCH: 32.9 pg (ref 26.0–34.0)
MCHC: 33.8 g/dL (ref 30.0–36.0)
MCV: 97.4 fL (ref 80.0–100.0)
Platelets: 171 10*3/uL (ref 150–400)
RBC: 4.62 MIL/uL (ref 4.22–5.81)
RDW: 13.3 % (ref 11.5–15.5)
WBC: 6.5 10*3/uL (ref 4.0–10.5)
nRBC: 0 % (ref 0.0–0.2)

## 2023-06-06 LAB — GASTROINTESTINAL PANEL BY PCR, STOOL (REPLACES STOOL CULTURE)

## 2023-06-06 LAB — COMPREHENSIVE METABOLIC PANEL WITH GFR
ALT: 43 U/L (ref 0–44)
AST: 68 U/L — ABNORMAL HIGH (ref 15–41)
Albumin: 3.1 g/dL — ABNORMAL LOW (ref 3.5–5.0)
Alkaline Phosphatase: 46 U/L (ref 38–126)
Anion gap: 11 (ref 5–15)
BUN: 8 mg/dL (ref 6–20)
CO2: 23 mmol/L (ref 22–32)
Calcium: 8.1 mg/dL — ABNORMAL LOW (ref 8.9–10.3)
Chloride: 99 mmol/L (ref 98–111)
Creatinine, Ser: 0.75 mg/dL (ref 0.61–1.24)
GFR, Estimated: >60 mL/min (ref >60)
Glucose, Bld: 90 mg/dL (ref 70–99)
Potassium: 3.5 mmol/L (ref 3.5–5.1)
Sodium: 133 mmol/L — ABNORMAL LOW (ref 135–145)
Total Bilirubin: 1.4 mg/dL — ABNORMAL HIGH (ref 0.0–1.2)
Total Protein: 5.7 g/dL — ABNORMAL LOW (ref 6.5–8.1)

## 2023-06-06 LAB — C DIFFICILE QUICK SCREEN W PCR REFLEX
C Diff antigen: NEGATIVE
C Diff interpretation: NOT DETECTED
C Diff toxin: NEGATIVE

## 2023-06-06 LAB — HIV ANTIBODY (ROUTINE TESTING W REFLEX): HIV Screen 4th Generation wRfx: NONREACTIVE

## 2023-06-06 MED ORDER — TRAMADOL HCL 50 MG PO TABS
50.0000 mg | ORAL_TABLET | Freq: Four times a day (QID) | ORAL | Status: DC | PRN
  Administered 2023-06-06 – 2023-06-09 (×7): 50 mg via ORAL
  Filled 2023-06-06 (×7): qty 1

## 2023-06-06 MED ORDER — MAGNESIUM SULFATE 2 GM/50ML IV SOLN
2.0000 g | Freq: Once | INTRAVENOUS | Status: AC
  Administered 2023-06-06: 2 g via INTRAVENOUS
  Filled 2023-06-06: qty 50

## 2023-06-06 MED ORDER — METHOCARBAMOL 1000 MG/10ML IJ SOLN
500.0000 mg | Freq: Four times a day (QID) | INTRAMUSCULAR | Status: DC | PRN
  Administered 2023-06-06 – 2023-06-09 (×11): 500 mg via INTRAVENOUS
  Filled 2023-06-06 (×9): qty 10

## 2023-06-06 MED ORDER — KETOROLAC TROMETHAMINE 30 MG/ML IJ SOLN
30.0000 mg | Freq: Once | INTRAMUSCULAR | Status: AC
  Administered 2023-06-06: 30 mg via INTRAVENOUS
  Filled 2023-06-06: qty 1

## 2023-06-06 MED ORDER — POTASSIUM CHLORIDE CRYS ER 20 MEQ PO TBCR
40.0000 meq | EXTENDED_RELEASE_TABLET | Freq: Once | ORAL | Status: AC
  Administered 2023-06-06: 40 meq via ORAL
  Filled 2023-06-06: qty 2

## 2023-06-06 MED ORDER — DICYCLOMINE HCL 10 MG PO CAPS
10.0000 mg | ORAL_CAPSULE | Freq: Four times a day (QID) | ORAL | Status: DC | PRN
  Administered 2023-06-06: 10 mg via ORAL
  Filled 2023-06-06: qty 1

## 2023-06-06 MED ORDER — OXYCODONE HCL 5 MG PO TABS
5.0000 mg | ORAL_TABLET | Freq: Four times a day (QID) | ORAL | Status: AC | PRN
  Administered 2023-06-06 – 2023-06-07 (×3): 5 mg via ORAL
  Filled 2023-06-06 (×3): qty 1

## 2023-06-06 MED ORDER — OXYCODONE HCL 5 MG PO TABS: 5.0000 mg | ORAL_TABLET | Freq: Once | ORAL | Status: DC

## 2023-06-06 NOTE — Progress Notes (Signed)
 PROGRESS NOTE    Jordan Smith  ZOX:[id number] DOB: 1975-12-07 DOA: 06/05/2023 PCP: Maryln Sober, PA-C    Brief Narrative:  Jordan Smith is a 48 y.o. male with medical history significant of hypertension, hyperlipidemia, polycythemia, sleep apnea on CPAP who presented to the emergency department with abdominal pain, dyspepsia, nausea and vomiting.  He has had multiple episodes of diarrhea, but seems to be constipated in recent days.  He has been having symptoms for about a month and a half.  Possible sick contacts at home with his wife and daughter having similar symptoms.   Assessment and Plan: Enterocolitis -c diff negative -GI pathogen panel pending Continue ceftriaxone  2 g every 24 hours. Continue metronidazole  500 mg IVPB q 12 hr.     Hypokalemia -replete along with Mg     Hyponatremia -trend     Class 1 obesity Estimated body mass index is 33.79 kg/m as calculated from the following:   Height as of this encounter: 6' (1.829 m).   Weight as of this encounter: 113 kg.      Elevated LFTs -trend     Mild intermittent asthma without complication Bronchodilators as needed.   DVT prophylaxis:     Code Status: Full Code   Disposition Plan:  Level of care: Med-Surg Status is: Inpatient     Consultants:  GI   Subjective: Still distended Did not like dilaudid   Objective: Vitals:   06/05/23 0825 06/05/23 1033 06/05/23 1527 06/05/23 2210  BP:  (!) 128/99 139/71 (!) 160/104  Pulse:  62 75 77  Resp:  16 16 18   Temp: 98.6 F (37 C) 98.2 F (36.8 C) 98.2 F (36.8 C) 98.8 F (37.1 C)  TempSrc: Oral Oral  Oral  SpO2:  98% 98% 99%  Weight:      Height:        Intake/Output Summary (Last 24 hours) at 06/06/2023 1202 Last data filed at 06/06/2023 0600 Gross per 24 hour  Intake 974.7 ml  Output --  Net 974.7 ml   Filed Weights   06/05/23 0358  Weight: 113 kg    Examination:   General: Appearance:    Obese male in no acute distress      Lungs:     respirations unlabored  Heart:    Normal heart rate.    MS:   All extremities are intact.    Neurologic:   Awake, alert       Data Reviewed: I have personally reviewed following labs and imaging studies  CBC: Recent Labs  Lab 06/01/23 1910 06/03/23 1421 06/05/23 0411 06/06/23 0449  WBC 8.3 7.2 6.4 6.5  NEUTROABS  --  5.8  --   --   HGB 17.4* 17.2* 15.8 15.2  HCT 51.1 51.5 44.6 45.0  MCV 96.4 98.8 93.9 97.4  PLT 254 245.0 192 171   Basic Metabolic Panel: Recent Labs  Lab 06/01/23 1910 06/03/23 1421 06/05/23 0411 06/06/23 0449  NA 138 137 131* 133*  K 4.5 3.9 3.4* 3.5  CL 97* 93* 92* 99  CO2 25 32 27 23  GLUCOSE 122* 107* 95 90  BUN 8 9 11 8   CREATININE 0.93 1.01 0.93 0.75  CALCIUM 9.7 9.8 9.0 8.1*  MG  --  1.6  --   --    GFR: Estimated Creatinine Clearance: 145 mL/min (by C-G formula based on SCr of 0.75 mg/dL). Liver Function Tests: Recent Labs  Lab 06/01/23 1910 06/03/23 1421 06/05/23 0411 06/06/23 0449  AST 98* 128* 57*  68*  ALT 77* 64* 43 43  ALKPHOS 80 69 55 46  BILITOT 1.5* 1.3* 1.6* 1.4*  PROT 7.6 7.4 6.3* 5.7*  ALBUMIN 4.1 4.6 4.0 3.1*   Recent Labs  Lab 06/01/23 1910 06/05/23 0411  LIPASE 37 32   No results for input(s): "AMMONIA" in the last 168 hours. Coagulation Profile: Recent Labs  Lab 06/06/23 0449  INR 1.1   Cardiac Enzymes: No results for input(s): "CKTOTAL", "CKMB", "CKMBINDEX", "TROPONINI" in the last 168 hours. BNP (last 3 results) No results for input(s): "PROBNP" in the last 8760 hours. HbA1C: No results for input(s): "HGBA1C" in the last 72 hours. CBG: No results for input(s): "GLUCAP" in the last 168 hours. Lipid Profile: No results for input(s): "CHOL", "HDL", "LDLCALC", "TRIG", "CHOLHDL", "LDLDIRECT" in the last 72 hours. Thyroid  Function Tests: No results for input(s): "TSH", "T4TOTAL", "FREET4", "T3FREE", "THYROIDAB" in the last 72 hours. Anemia Panel: Recent Labs    06/03/23 1421   FERRITIN 199.7  TIBC 371.0  IRON 159   Sepsis Labs: No results for input(s): "PROCALCITON", "LATICACIDVEN" in the last 168 hours.  Recent Results (from the past 240 hours)  C Difficile Quick Screen w PCR reflex     Status: None   Collection Time: 06/05/23  3:05 AM   Specimen: STOOL  Result Value Ref Range Status   C Diff antigen NEGATIVE NEGATIVE Final   C Diff toxin NEGATIVE NEGATIVE Final   C Diff interpretation No C. difficile detected.  Final    Comment: Performed at [facility], [address], [place], [place] [address]  Blood culture (routine x 2)     Status: None (Preliminary result)   Collection Time: 06/05/23  7:46 AM   Specimen: BLOOD RIGHT HAND  Result Value Ref Range Status   Specimen Description   Final    BLOOD RIGHT HAND Performed at [facility], [address], [place], [place] [address]    Special Requests   Final    Blood Culture results may not be optimal due to an inadequate volume of blood received in culture bottles BOTTLES DRAWN AEROBIC AND ANAEROBIC Performed at [facility], [address] [place], [place] [address]    Culture   Final    NO GROWTH < 24 HOURS Performed at [facility], [address] [place], [place] [address]    Report Status PENDING  Incomplete  Blood culture (routine x 2)     Status: None (Preliminary result)   Collection Time: 06/05/23  7:51 AM   Specimen: BLOOD  Result Value Ref Range Status   Specimen Description   Final    BLOOD LEFT ANTECUBITAL Performed at [facility], [address] [place], [place] [address]    Special Requests   Final    Blood Culture results may not be optimal due to an inadequate volume of blood received in culture bottles BOTTLES DRAWN AEROBIC AND ANAEROBIC Performed at [facility], [address], [place], [place] [address]    Culture   Final    NO GROWTH < 24 HOURS Performed at [facility], [address] [place], [place] [address]    Report Status PENDING  Incomplete         Radiology Studies: CT ABDOMEN PELVIS W CONTRAST Result Date: 06/05/2023 CLINICAL DATA:  48 year old male with abdominal pain for 2-3 weeks with nausea and vomiting. Recently diagnosed with Crohn disease. EXAM: CT ABDOMEN AND PELVIS WITH CONTRAST  TECHNIQUE: Multidetector CT imaging of the abdomen and pelvis was performed using the standard protocol following bolus administration of intravenous contrast. RADIATION DOSE REDUCTION: This exam was performed according to the departmental dose-optimization program which includes automated exposure control, adjustment of the mA and/or kV according to patient size and/or use of iterative reconstruction technique. CONTRAST:  100mL OMNIPAQUE  IOHEXOL  300 MG/ML  SOLN COMPARISON:  Recent CTs Abdomen and Pelvis 06/01/2023 and earlier. FINDINGS: Lower chest: Stable and negative aside from minor lung base atelectasis. Hepatobiliary: Increased perihepatic ascites with simple fluid density, small volume (series 2, image 20). Underlying hepatic steatosis and gallbladder appear stable. Pancreas: Negative. Spleen: Negative. Adrenals/Urinary Tract: Negative adrenal glands and kidneys. Diminutive urinary bladder with nonspecific wall thickening and mild perivesical inflammatory stranding on series 2, image 82. No urinary calculus. Stomach/Bowel: Fluid in the large bowel to the rectum. Nondilated large and small bowel loops, but inflamed, thickened distal small bowel in the right lower quadrant very similar to the CT Abdomen and Pelvis 4 days ago (coronal image 86). Mesenteric inflammation and small volume of mesenteric free fluid around the affected loops. Gradual transition to more normal appearing upstream small bowel which is mildly gas distended in the anterior central abdomen. Other jejunum loops are decompressed and within normal limits. Small volume of free fluid also at the root of the  small bowel mesentery. Stomach and duodenum decompressed. No pneumoperitoneum. Vascular/Lymphatic: Aortoiliac calcified atherosclerosis. Normal caliber abdominal aorta. Major arterial structures are patent. Portal venous system is patent. No lymphadenopathy identified. Reproductive: Negative. Other: Low-density free fluid continues layering in the anterior lower peritoneal cavity. No free fluid in the deep pelvis. Musculoskeletal: Stable and negative. IMPRESSION: 1. Ongoing Enterocolitis, appearance not significantly changed from CT four days ago. Increased simple fluid density ascites since that time. No evidence of bowel obstruction or perforation. 2. Diminutive urinary bladder appears inflamed. Consider superimposed Cystitis or UTI. 3. Hepatic steatosis. 4.  Aortic Atherosclerosis (ICD10-I70.0). Electronically Signed   By: Marlise Simpers M.D.   On: 06/05/2023 06:51        Scheduled Meds:  enoxaparin  (LOVENOX ) injection  60 mg Subcutaneous Q24H   ondansetron  (ZOFRAN ) IV  4 mg Intravenous Q6H   pantoprazole   40 mg Oral BID   Continuous Infusions:  cefTRIAXone  (ROCEPHIN )  IV 2 g (06/06/23 1020)   metronidazole  500 mg (06/06/23 0911)     LOS: 1 day    Time spent: 45 minutes spent on chart review, discussion with nursing staff, consultants, updating family and interview/physical exam; more than 50% of that time was spent in counseling and/or coordination of care.    Enrigue Harvard, DO [facility] Available via Epic secure chat 7am-7pm After these hours, please refer to coverage provider listed on [email or web address] 06/06/2023, 12:02 PM

## 2023-06-06 NOTE — Plan of Care (Signed)

## 2023-06-06 NOTE — Progress Notes (Signed)
   06/06/23 2259  BiPAP/CPAP/SIPAP  $ Non-Invasive Ventilator  Non-Invasive Vent Set Up  $ Face Mask Large  Yes  BiPAP/CPAP/SIPAP Pt Type Adult  BiPAP/CPAP/SIPAP Resmed  Mask Type Full face mask  Dentures removed? Not applicable  Mask Size Large  Respiratory Rate 16 breaths/min  EPAP  (Auto titration mode between 6 and 18)  Pressure Support 4 cmH20  FiO2 (%) 21 %  Patient Home Machine No  Patient Home Mask No  Patient Home Tubing No  Auto Titrate Yes  Minimum cmH2O 6 cmH2O  Maximum cmH2O 18 cmH2O  BiPAP/CPAP /SiPAP Vitals  Pulse Rate 88  Resp 16  SpO2 97 %  Bilateral Breath Sounds Clear;Diminished  MEWS Score/Color  MEWS Score 0  MEWS Score Color Green

## 2023-06-06 NOTE — Progress Notes (Addendum)
 Daily Progress Note  DOA: 06/05/2023 Hospital Day: 2   Chief Complaint: Abdominal pain and diarrhea, enterocolitis on CT scan   Attending physician's note   I personally saw the patient and performed a substantive portion of this encounter (>50% time spent), including a complete performance of at least one of the key components (MDM, Hx and/or Exam), in conjunction with the APP.  I agree with the APP's note, impression, and recommendations with additional input as follows.     New onset enterocolitis infectious etiology vs IBD Await GI pathogen panel Will tentatively plan for colonoscopy on Monday Continue clear liquid diet  The patient was provided an opportunity to ask questions and all were answered. The patient agreed with the plan and demonstrated an understanding of the instructions.   Lorena Rolling , MD [phone]     ASSESSMENT    48 y.o. year old male with a history of obesity, hypertension, OSA, hepatic steatosis, colon polyps.  Admitted with crampy diarrhea and enterocolitis on CT scan  Nausea, vomiting, crampy diarrhea over the last several weeks Enterocolitis on CT scan.  Infectious etiology versus new Crohn's ileocolitis.  C. difficile negative.  GI path panel is pending Today:  Diarrhea has slowed down, just had a stool that was partially formed with some liquid.  Continues to have generalized abdominal cramping.  Afebrile with normal white count  GERD with grade C esophagitis   Hepatic steatosis, marked on CT scan Liver exzymes intermittently elevated. Appears our office is scheduling him for an elastrography   Abnormal bladder on Ct scan, inflamed?    PLAN   --Continue twice daily pantoprazole  --Continue empiric Rocephin  and Flagyl  for now --Will start clear liquid diet tomorrow pending GI path panel results.  If stool studies negative then we will proceed with colonoscopy on Monday -- Continue Zofran  as needed for nausea   Subjective    On full liquid diet. Has continued to have generalized abdominal cramping. Bentyl  helps. Diarrhea continues to improve though cramps persist. Toradol  helps, doesn't want to take any  more narcotics.  Some nausea but no vomiting. Zofran  helps   Objective   GI Studies:    CTAP with contrast 06/05/2023 MPRESSION: 1. Ongoing Enterocolitis, appearance not significantly changed from CT four days ago. Increased simple fluid density ascites since that time. No evidence of bowel obstruction or perforation. 2. Diminutive urinary bladder appears inflamed. Consider superimposed Cystitis or UTI. 3. Hepatic steatosis. 4.  Aortic Atherosclerosis (ICD10-I70.0).  CTAP with contrast 06/01/2023 Interval development of extensive submucosal edema, surrounding inflammatory stranding, and mesenteric edema involving the terminal ileum and ascending colon in keeping with infectious or inflammatory enterocolitis as can be seen with Crohn's enterocolitis. There are milder, new, regionally distinct inflammatory changes also identified within the descending and sigmoid colon. Interval development of mild ascites. No evidence of obstruction or perforation. 2. Marked hepatic steatosis. 3.  Aortic Atherosclerosis (ICD10-I70.0).   Recent Labs    06/03/23 1421 06/05/23 0411 06/06/23 0449  WBC 7.2 6.4 6.5  HGB 17.2* 15.8 15.2  HCT 51.5 44.6 45.0  MCV 98.8 93.9 97.4  PLT 245.0 192 171   Recent Labs    06/03/23 1421  FERRITIN 199.7  TIBC 371.0  IRONPCTSAT 42.9   Recent Labs    06/03/23 1421 06/05/23 0411 06/06/23 0449  NA 137 131* 133*  K 3.9 3.4* 3.5  CL 93* 92* 99  CO2 32 27 23  GLUCOSE 107* 95 90  BUN 9 11 8  CREATININE 1.01 0.93 0.75  CALCIUM 9.8 9.0 8.1*   Recent Labs    06/03/23 1421 06/05/23 0411 06/06/23 0449  PROT 7.4 6.3* 5.7*  ALBUMIN 4.6 4.0 3.1*  AST 128* 57* 68*  ALT 64* 43 43  ALKPHOS 69 55 46  BILITOT 1.3* 1.6* 1.4*   Recent Labs    06/06/23 0449  INR 1.1    No results for input(s): "AFPTUMOR" in the last 72 hours.   No results for input(s): "ANA" in the last 72 hours.  Imaging:  CT ABDOMEN PELVIS W CONTRAST CLINICAL DATA:  48 year old male with abdominal pain for 2-3 weeks with nausea and vomiting. Recently diagnosed with Crohn disease.  EXAM: CT ABDOMEN AND PELVIS WITH CONTRAST  TECHNIQUE: Multidetector CT imaging of the abdomen and pelvis was performed using the standard protocol following bolus administration of intravenous contrast.  RADIATION DOSE REDUCTION: This exam was performed according to the departmental dose-optimization program which includes automated exposure control, adjustment of the mA and/or kV according to patient size and/or use of iterative reconstruction technique.  CONTRAST:  100mL OMNIPAQUE  IOHEXOL  300 MG/ML  SOLN  COMPARISON:  Recent CTs Abdomen and Pelvis 06/01/2023 and earlier.  FINDINGS: Lower chest: Stable and negative aside from minor lung base atelectasis.  Hepatobiliary: Increased perihepatic ascites with simple fluid density, small volume (series 2, image 20). Underlying hepatic steatosis and gallbladder appear stable.  Pancreas: Negative.  Spleen: Negative.  Adrenals/Urinary Tract: Negative adrenal glands and kidneys. Diminutive urinary bladder with nonspecific wall thickening and mild perivesical inflammatory stranding on series 2, image 82. No urinary calculus.  Stomach/Bowel: Fluid in the large bowel to the rectum. Nondilated large and small bowel loops, but inflamed, thickened distal small bowel in the right lower quadrant very similar to the CT Abdomen and Pelvis 4 days ago (coronal image 86). Mesenteric inflammation and small volume of mesenteric free fluid around the affected loops. Gradual transition to more normal appearing upstream small bowel which is mildly gas distended in the anterior central abdomen. Other jejunum loops are decompressed and within normal limits.  Small volume of free fluid also at the root of the small bowel mesentery. Stomach and duodenum decompressed. No pneumoperitoneum.  Vascular/Lymphatic: Aortoiliac calcified atherosclerosis. Normal caliber abdominal aorta. Major arterial structures are patent. Portal venous system is patent. No lymphadenopathy identified.  Reproductive: Negative.  Other: Low-density free fluid continues layering in the anterior lower peritoneal cavity. No free fluid in the deep pelvis.  Musculoskeletal: Stable and negative.  IMPRESSION: 1. Ongoing Enterocolitis, appearance not significantly changed from CT four days ago. Increased simple fluid density ascites since that time. No evidence of bowel obstruction or perforation. 2. Diminutive urinary bladder appears inflamed. Consider superimposed Cystitis or UTI. 3. Hepatic steatosis. 4.  Aortic Atherosclerosis (ICD10-I70.0).  Electronically Signed   By: Marlise Simpers M.D.   On: 06/05/2023 06:51     Scheduled inpatient medications:   enoxaparin  (LOVENOX ) injection  60 mg Subcutaneous Q24H   ondansetron  (ZOFRAN ) IV  4 mg Intravenous Q6H   pantoprazole   40 mg Oral BID   potassium chloride   40 mEq Oral Once   Continuous inpatient infusions:   cefTRIAXone  (ROCEPHIN )  IV 2 g (06/06/23 1020)   magnesium  sulfate bolus IVPB     metronidazole  500 mg (06/06/23 0911)   PRN inpatient medications: acetaminophen  **OR** acetaminophen , HYDROmorphone  (DILAUDID ) injection, methocarbamol  (ROBAXIN ) injection, ondansetron  **OR** ondansetron  (ZOFRAN ) IV, traMADol   Vital signs in last 24 hours: Temp:  [98.2 F (36.8 C)-98.8 F (37.1 C)] 98.8 F (  37.1 C) (04/18 2210) Pulse Rate:  [75-77] 77 (04/18 2210) Resp:  [16-18] 18 (04/18 2210) BP: (139-160)/(71-104) 160/104 (04/18 2210) SpO2:  [98 %-99 %] 99 % (04/18 2210) Last BM Date :  (PTA)  Intake/Output Summary (Last 24 hours) at 06/06/2023 1303 Last data filed at 06/06/2023 0600 Gross per 24 hour  Intake 974.7 ml   Output --  Net 974.7 ml    Intake/Output from previous day: 04/18 0701 - 04/19 0700 In: 2174.7 [P.O.:500; I.V.:474.7; IV Piggyback:1200] Out: -  Intake/Output this shift: No intake/output data recorded.   Physical Exam:  General: Alert male in NAD Heart:  Regular rate and rhythm.  Pulmonary: Normal respiratory effort Abdomen: Soft, nondistended, nontender. Normal bowel sounds. Extremities: No lower extremity edema  Neurologic: Alert and oriented Psych: Pleasant. Cooperative     LOS: 1 day   Mai Schwalbe ,NP 06/06/2023, 1:03 PM

## 2023-06-07 DIAGNOSIS — R109 Unspecified abdominal pain: Secondary | ICD-10-CM | POA: Diagnosis not present

## 2023-06-07 DIAGNOSIS — R1114 Bilious vomiting: Secondary | ICD-10-CM

## 2023-06-07 DIAGNOSIS — R933 Abnormal findings on diagnostic imaging of other parts of digestive tract: Secondary | ICD-10-CM | POA: Diagnosis not present

## 2023-06-07 DIAGNOSIS — K529 Noninfective gastroenteritis and colitis, unspecified: Secondary | ICD-10-CM | POA: Diagnosis not present

## 2023-06-07 DIAGNOSIS — R634 Abnormal weight loss: Secondary | ICD-10-CM

## 2023-06-07 LAB — C-REACTIVE PROTEIN: CRP: 4.2 mg/dL — ABNORMAL HIGH (ref <1.0)

## 2023-06-07 MED ORDER — SIMETHICONE 80 MG PO CHEW
240.0000 mg | CHEWABLE_TABLET | Freq: Once | ORAL | Status: AC
  Administered 2023-06-07: 240 mg via ORAL
  Filled 2023-06-07: qty 3

## 2023-06-07 MED ORDER — NA SULFATE-K SULFATE-MG SULF 17.5-3.13-1.6 GM/177ML PO SOLN
0.5000 | Freq: Once | ORAL | Status: AC
  Administered 2023-06-07: 177 mL via ORAL
  Filled 2023-06-07 (×2): qty 1

## 2023-06-07 MED ORDER — NA SULFATE-K SULFATE-MG SULF 17.5-3.13-1.6 GM/177ML PO SOLN
0.5000 | Freq: Once | ORAL | Status: AC
  Administered 2023-06-07: 177 mL via ORAL

## 2023-06-07 NOTE — Plan of Care (Signed)

## 2023-06-07 NOTE — Progress Notes (Signed)
 Patient was getting up to go to bathroom and broke his CPAP mask.  I had given him my last large mask that I had.  I showed patient how to take mask off earlier, but seeing where mask was broken, I believe patient tried taking off mask without twisting off the clasps.  I suggested that patient have family member bring his home mask to use with our machine.  Patient stated that he would do that.

## 2023-06-07 NOTE — Progress Notes (Signed)
 PROGRESS NOTE    Jordan Smith  ZOX:[id number] DOB: 1975-05-29 DOA: 06/05/2023 PCP: Maryln Sober, PA-C    Brief Narrative:  Jordan Smith is a 48 y.o. male with medical history significant of hypertension, hyperlipidemia, polycythemia, sleep apnea on CPAP who presented to the emergency department with abdominal pain, dyspepsia, nausea and vomiting.  He has had multiple episodes of diarrhea, but seems to be constipated in recent days.  He has been having symptoms for about a month and a half.  Possible sick contacts at home with his wife and daughter having similar symptoms.   Assessment and Plan: Enterocolitis -c diff negative -GI pathogen panel negative Continue ceftriaxone  2 g every 24 hours. Continue metronidazole  500 mg IVPB q 12 hr. -colonoscopy on Monday per GI    Hypokalemia -replete along with Mg     Hyponatremia -trend     Class 1 obesity Estimated body mass index is 33.79 kg/m as calculated from the following:   Height as of this encounter: 6' (1.829 m).   Weight as of this encounter: 113 kg.      Elevated LFTs -trend     Mild intermittent asthma without complication Bronchodilators as needed.   DVT prophylaxis:     Code Status: Full Code   Disposition Plan:  Level of care: Med-Surg Status is: Inpatient     Consultants:  GI   Subjective: Tolerating sips  Objective: Vitals:   06/06/23 2035 06/06/23 2259 06/07/23 0429 06/07/23 0429  BP: (!) 154/106  (!) 152/103 (!) 152/103  Pulse: 82 88 86 91  Resp: 16 16 18 18   Temp: 99.4 F (37.4 C)  98.9 F (37.2 C) 98.9 F (37.2 C)  TempSrc:      SpO2: 98% 97% 98% 97%  Weight:      Height:        Intake/Output Summary (Last 24 hours) at 06/07/2023 1057 Last data filed at 06/07/2023 0500 Gross per 24 hour  Intake 500 ml  Output --  Net 500 ml   Filed Weights   06/05/23 0358  Weight: 113 kg    Examination:   General: Appearance:    Obese male in no acute distress     Lungs:      respirations unlabored  Heart:    Normal heart rate.    MS:   All extremities are intact.    Neurologic:   Awake, alert       Data Reviewed: I have personally reviewed following labs and imaging studies  CBC: Recent Labs  Lab 06/01/23 1910 06/03/23 1421 06/05/23 0411 06/06/23 0449  WBC 8.3 7.2 6.4 6.5  NEUTROABS  --  5.8  --   --   HGB 17.4* 17.2* 15.8 15.2  HCT 51.1 51.5 44.6 45.0  MCV 96.4 98.8 93.9 97.4  PLT 254 245.0 192 171   Basic Metabolic Panel: Recent Labs  Lab 06/01/23 1910 06/03/23 1421 06/05/23 0411 06/06/23 0449  NA 138 137 131* 133*  K 4.5 3.9 3.4* 3.5  CL 97* 93* 92* 99  CO2 25 32 27 23  GLUCOSE 122* 107* 95 90  BUN 8 9 11 8   CREATININE 0.93 1.01 0.93 0.75  CALCIUM 9.7 9.8 9.0 8.1*  MG  --  1.6  --   --    GFR: Estimated Creatinine Clearance: 145 mL/min (by C-G formula based on SCr of 0.75 mg/dL). Liver Function Tests: Recent Labs  Lab 06/01/23 1910 06/03/23 1421 06/05/23 0411 06/06/23 0449  AST 98* 128* 57* 68*  ALT 77* 64* 43 43  ALKPHOS 80 69 55 46  BILITOT 1.5* 1.3* 1.6* 1.4*  PROT 7.6 7.4 6.3* 5.7*  ALBUMIN 4.1 4.6 4.0 3.1*   Recent Labs  Lab 06/01/23 1910 06/05/23 0411  LIPASE 37 32   No results for input(s): "AMMONIA" in the last 168 hours. Coagulation Profile: Recent Labs  Lab 06/06/23 0449  INR 1.1   Cardiac Enzymes: No results for input(s): "CKTOTAL", "CKMB", "CKMBINDEX", "TROPONINI" in the last 168 hours. BNP (last 3 results) No results for input(s): "PROBNP" in the last 8760 hours. HbA1C: No results for input(s): "HGBA1C" in the last 72 hours. CBG: No results for input(s): "GLUCAP" in the last 168 hours. Lipid Profile: No results for input(s): "CHOL", "HDL", "LDLCALC", "TRIG", "CHOLHDL", "LDLDIRECT" in the last 72 hours. Thyroid  Function Tests: No results for input(s): "TSH", "T4TOTAL", "FREET4", "T3FREE", "THYROIDAB" in the last 72 hours. Anemia Panel: No results for input(s): "VITAMINB12", "FOLATE",  "FERRITIN", "TIBC", "IRON", "RETICCTPCT" in the last 72 hours.  Sepsis Labs: No results for input(s): "PROCALCITON", "LATICACIDVEN" in the last 168 hours.  Recent Results (from the past 240 hours)  C Difficile Quick Screen w PCR reflex     Status: None   Collection Time: 06/05/23  3:05 AM   Specimen: STOOL  Result Value Ref Range Status   C Diff antigen NEGATIVE NEGATIVE Final   C Diff toxin NEGATIVE NEGATIVE Final   C Diff interpretation No C. difficile detected.  Final    Comment: Performed at [facility], [address] [place], [place] [address]  Blood culture (routine x 2)     Status: None (Preliminary result)   Collection Time: 06/05/23  7:46 AM   Specimen: BLOOD RIGHT HAND  Result Value Ref Range Status   Specimen Description   Final    BLOOD RIGHT HAND Performed at [facility], [address], [place], [place] [address]    Special Requests   Final    Blood Culture results may not be optimal due to an inadequate volume of blood received in culture bottles BOTTLES DRAWN AEROBIC AND ANAEROBIC Performed at [facility], [address], [place], [place] [address]    Culture   Final    NO GROWTH 2 DAYS Performed at [facility], [address] [place], [place] [address]    Report Status PENDING  Incomplete  Blood culture (routine x 2)     Status: None (Preliminary result)   Collection Time: 06/05/23  7:51 AM   Specimen: BLOOD  Result Value Ref Range Status   Specimen Description   Final    BLOOD LEFT ANTECUBITAL Performed at [facility], [address] [place], [place] [address]    Special Requests   Final    Blood Culture results may not be optimal due to an inadequate volume of blood received in culture bottles BOTTLES DRAWN AEROBIC AND ANAEROBIC Performed at [facility], [address] [place], [place] [address]    Culture   Final    NO GROWTH 2 DAYS Performed at [facility], [address] [place], [place] [address]    Report Status PENDING  Incomplete  Gastrointestinal Panel by PCR , Stool     Status: None   Collection Time: 06/06/23  3:05 AM   Specimen: STOOL  Result Value Ref Range Status   Campylobacter species NOT DETECTED NOT DETECTED Final   Plesimonas shigelloides NOT DETECTED NOT DETECTED Final  Salmonella species NOT DETECTED NOT DETECTED Final   Yersinia enterocolitica NOT DETECTED NOT DETECTED Final   Vibrio species NOT DETECTED NOT DETECTED Final   Vibrio cholerae NOT DETECTED NOT DETECTED Final   Enteroaggregative E coli (EAEC) NOT DETECTED NOT DETECTED Final   Enteropathogenic E coli (EPEC) NOT DETECTED NOT DETECTED Final   Enterotoxigenic E coli (ETEC) NOT DETECTED NOT DETECTED Final   Shiga like toxin producing E coli (STEC) NOT DETECTED NOT DETECTED Final   Shigella/Enteroinvasive E coli (EIEC) NOT DETECTED NOT DETECTED Final   Cryptosporidium NOT DETECTED NOT DETECTED Final   Cyclospora cayetanensis NOT DETECTED NOT DETECTED Final   Entamoeba histolytica NOT DETECTED NOT DETECTED Final   Giardia lamblia NOT DETECTED NOT DETECTED Final   Adenovirus F40/41 NOT DETECTED NOT DETECTED Final   Astrovirus NOT DETECTED NOT DETECTED Final   Norovirus GI/GII NOT DETECTED NOT DETECTED Final   Rotavirus A NOT DETECTED NOT DETECTED Final   Sapovirus (I, II, IV, and V) NOT DETECTED NOT DETECTED Final    Comment: Performed at [facility], [address] [place], [place] [address]         Radiology Studies: No results found.       Scheduled Meds:  enoxaparin  (LOVENOX ) injection  60 mg Subcutaneous Q24H   ondansetron  (ZOFRAN ) IV  4 mg Intravenous Q6H   pantoprazole   40 mg Oral BID   Continuous Infusions:  cefTRIAXone  (ROCEPHIN )  IV 2 g (06/07/23 0810)   metronidazole  500 mg (06/07/23 0858)     LOS: 2 days    Time spent: 45 minutes spent on chart review, discussion with nursing staff, consultants,  updating family and interview/physical exam; more than 50% of that time was spent in counseling and/or coordination of care.    Enrigue Harvard, DO [facility] Available via Epic secure chat 7am-7pm After these hours, please refer to coverage provider listed on [email or web address] 06/07/2023, 10:57 AM

## 2023-06-07 NOTE — Progress Notes (Signed)
 Gastroenterology Inpatient Follow-up Note   PATIENT IDENTIFICATION  Jordan Smith is a 48 y.o. male Hospital Day: 3  SUBJECTIVE  The patient's chart has been reviewed. The patient's labs have been reviewed.  Only a CRP was obtained this morning and is pending.  Yesterday's GI pathogen panel has returned negative.  C. difficile quick screen was negative. Today, the patient is not significantly changed from yesterday into today. Still having abdominal cramping and spasms. Bowel movements still loose at times. No blood has been noted. He denies fevers or chills.   OBJECTIVE  Scheduled Inpatient Medications:   enoxaparin  (LOVENOX ) injection  60 mg Subcutaneous Q24H   ondansetron  (ZOFRAN ) IV  4 mg Intravenous Q6H   pantoprazole   40 mg Oral BID   Continuous Inpatient Infusions:   cefTRIAXone  (ROCEPHIN )  IV 2 g (06/07/23 0810)   metronidazole  500 mg (06/07/23 0858)   PRN Inpatient Medications: acetaminophen  **OR** acetaminophen , dicyclomine , methocarbamol  (ROBAXIN ) injection, ondansetron  **OR** ondansetron  (ZOFRAN ) IV, traMADol    Physical Examination  Temp:  [98.9 F (37.2 C)-99.5 F (37.5 C)] 98.9 F (37.2 C) (04/20 0429) Pulse Rate:  [82-91] 91 (04/20 0429) Resp:  [16-18] 18 (04/20 0429) BP: (152-168)/(103-109) 152/103 (04/20 0429) SpO2:  [97 %-99 %] 97 % (04/20 0429) FiO2 (%):  [21 %] 21 % (04/19 2259) Temp (24hrs), Avg:99.2 F (37.3 C), Min:98.9 F (37.2 C), Max:99.5 F (37.5 C)  Weight: 113 kg GEN: NAD, appears stated age, doesn't appear chronically ill PSYCH: Cooperative, without pressured speech EYE: Conjunctivae pink, sclerae anicteric ENT: MMM CV: Nontachycardic RESP: No audible wheezing GI: NABS, soft, TTP generalized in lower abdomen, no rebound, volitional guarding present MSK/EXT: No significant lower extremity edema SKIN: No jaundice NEURO:  Alert & Oriented x 3, no focal deficits   Review of Data   Laboratory Studies   Recent Labs  Lab  06/03/23 1421 06/05/23 0411 06/06/23 0449  NA 137   < > 133*  K 3.9   < > 3.5  CL 93*   < > 99  CO2 32   < > 23  BUN 9   < > 8  CREATININE 1.01   < > 0.75  GLUCOSE 107*   < > 90  CALCIUM 9.8   < > 8.1*  MG 1.6  --   --    < > = values in this interval not displayed.   Recent Labs  Lab 06/06/23 0449  AST 68*  ALT 43  ALKPHOS 46    Recent Labs  Lab 06/03/23 1421 06/05/23 0411 06/06/23 0449  WBC 7.2 6.4 6.5  HGB 17.2* 15.8 15.2  HCT 51.5 44.6 45.0  PLT 245.0 192 171   Recent Labs  Lab 06/06/23 0449  INR 1.1   Imaging Studies  No results found.  GI Procedures and Studies  No new relevant studies to review   ASSESSMENT  Jordan Smith is a 48 y.o. male with a pmh significant for hypertension, hyperlipidemia, OSA, hepatic steatosis, colon polyps (previous TA's).  The GI service is consulted for evaluation and management of abdominal pain and abnormal imaging concerning for enterocolitis versus potential IBD.   The patient is hemodynamically and clinically stable at this time.  His GI pathogen panel and C. difficile were negative.  Although overall this still seems to be more likely to be an infectious etiology, the first presentation of an underlying inflammatory bowel disease should be considered since he has not improved with treatment.  Plan will be for colonoscopy tomorrow and  we will begin his preparation.  The risks and benefits of endoscopic evaluation were discussed with the patient; these include but are not limited to the risk of perforation, infection, bleeding, missed lesions, lack of diagnosis, severe illness requiring hospitalization, as well as anesthesia and sedation related illnesses.  The patient and/or family is agreeable to proceed.  Dr. Yvone Herd will be doing inpatient service for Jordan Smith and will be scheduled with her tomorrow afternoon.  All patient questions were answered to the best of my ability, and the patient agrees to the aforementioned plan of  action with follow-up as indicated.   PLAN/RECOMMENDATIONS  Clear liquid diet continues for now N.p.o. at midnight Colonoscopy for 4/21 with hope to intubate TI if possible - Bowel regimen as per bowel regimen order set will be placed - Okay if patient takes longer to drink his fluids as a result of his nausea and vomiting that he has experienced as long as he takes both portions of preparation - Use antiemetics as needed Continue antibiotics as you are doing Holding on steroids until after colonoscopy gives us  more sense of things   Please page/call with questions or concerns.   Yong Henle, MD [facility]oscopy Office # [phone]    LOS: 2 days  Normie Becton  06/07/2023, 1:02 PM

## 2023-06-08 ENCOUNTER — Inpatient Hospital Stay (HOSPITAL_COMMUNITY): Admitting: Certified Registered"

## 2023-06-08 ENCOUNTER — Encounter (HOSPITAL_COMMUNITY)
Admission: EM | Disposition: A | Discharge status: Home / Self Care | Payer: Self-pay | Source: Home / Self Care | Attending: Internal Medicine

## 2023-06-08 ENCOUNTER — Encounter (HOSPITAL_COMMUNITY): Payer: Self-pay | Admitting: *Deleted

## 2023-06-08 DIAGNOSIS — K635 Polyp of colon: Secondary | ICD-10-CM | POA: Diagnosis not present

## 2023-06-08 DIAGNOSIS — K648 Other hemorrhoids: Secondary | ICD-10-CM | POA: Diagnosis not present

## 2023-06-08 DIAGNOSIS — J45909 Unspecified asthma, uncomplicated: Secondary | ICD-10-CM

## 2023-06-08 DIAGNOSIS — R109 Unspecified abdominal pain: Secondary | ICD-10-CM | POA: Diagnosis not present

## 2023-06-08 DIAGNOSIS — K6389 Other specified diseases of intestine: Secondary | ICD-10-CM | POA: Diagnosis not present

## 2023-06-08 DIAGNOSIS — G4733 Obstructive sleep apnea (adult) (pediatric): Secondary | ICD-10-CM | POA: Diagnosis not present

## 2023-06-08 DIAGNOSIS — Z860101 Personal history of adenomatous and serrated colon polyps: Secondary | ICD-10-CM

## 2023-06-08 DIAGNOSIS — D128 Benign neoplasm of rectum: Secondary | ICD-10-CM

## 2023-06-08 DIAGNOSIS — K529 Noninfective gastroenteritis and colitis, unspecified: Secondary | ICD-10-CM | POA: Diagnosis not present

## 2023-06-08 DIAGNOSIS — R933 Abnormal findings on diagnostic imaging of other parts of digestive tract: Secondary | ICD-10-CM | POA: Diagnosis not present

## 2023-06-08 DIAGNOSIS — R1084 Generalized abdominal pain: Secondary | ICD-10-CM

## 2023-06-08 DIAGNOSIS — I10 Essential (primary) hypertension: Secondary | ICD-10-CM

## 2023-06-08 DIAGNOSIS — R197 Diarrhea, unspecified: Secondary | ICD-10-CM

## 2023-06-08 DIAGNOSIS — K760 Fatty (change of) liver, not elsewhere classified: Secondary | ICD-10-CM | POA: Diagnosis not present

## 2023-06-08 HISTORY — PX: COLONOSCOPY: SHX5424

## 2023-06-08 HISTORY — PX: BIOPSY OF SKIN SUBCUTANEOUS TISSUE AND/OR MUCOUS MEMBRANE: SHX6741

## 2023-06-08 LAB — COMPREHENSIVE METABOLIC PANEL WITH GFR
ALT: 28 U/L (ref 0–44)
AST: 29 U/L (ref 15–41)
Albumin: 3.1 g/dL — ABNORMAL LOW (ref 3.5–5.0)
Alkaline Phosphatase: 51 U/L (ref 38–126)
Anion gap: 12 (ref 5–15)
BUN: <5 mg/dL — ABNORMAL LOW (ref 6–20)
CO2: 23 mmol/L (ref 22–32)
Calcium: 8.2 mg/dL — ABNORMAL LOW (ref 8.9–10.3)
Chloride: 97 mmol/L — ABNORMAL LOW (ref 98–111)
Creatinine, Ser: 0.87 mg/dL (ref 0.61–1.24)
GFR, Estimated: >60 mL/min (ref >60)
Glucose, Bld: 94 mg/dL (ref 70–99)
Potassium: 4 mmol/L (ref 3.5–5.1)
Sodium: 132 mmol/L — ABNORMAL LOW (ref 135–145)
Total Bilirubin: 1.3 mg/dL — ABNORMAL HIGH (ref 0.0–1.2)
Total Protein: 5.7 g/dL — ABNORMAL LOW (ref 6.5–8.1)

## 2023-06-08 LAB — CBC
HCT: 45.1 % (ref 39.0–52.0)
Hemoglobin: 15.7 g/dL (ref 13.0–17.0)
MCH: 33.8 pg (ref 26.0–34.0)
MCHC: 34.8 g/dL (ref 30.0–36.0)
MCV: 97 fL (ref 80.0–100.0)
Platelets: 187 10*3/uL (ref 150–400)
RBC: 4.65 MIL/uL (ref 4.22–5.81)
RDW: 13.3 % (ref 11.5–15.5)
WBC: 6.8 10*3/uL (ref 4.0–10.5)
nRBC: 0 % (ref 0.0–0.2)

## 2023-06-08 LAB — CALPROTECTIN, FECAL: Calprotectin, Fecal: 39 ug/g (ref 0–120)

## 2023-06-08 SURGERY — COLONOSCOPY
Anesthesia: Monitor Anesthesia Care

## 2023-06-08 MED ORDER — SODIUM CHLORIDE 0.9 % IV SOLN
INTRAVENOUS | Status: DC | PRN
Start: 2023-06-08 — End: 2023-06-08

## 2023-06-08 MED ORDER — PROPOFOL 500 MG/50ML IV EMUL
INTRAVENOUS | Status: DC | PRN
  Administered 2023-06-08: 180 ug/kg/min via INTRAVENOUS

## 2023-06-08 MED ORDER — PROPOFOL 1000 MG/100ML IV EMUL
INTRAVENOUS | Status: AC
  Filled 2023-06-08: qty 100

## 2023-06-08 MED ORDER — LIDOCAINE 2% (20 MG/ML) 5 ML SYRINGE
INTRAMUSCULAR | Status: DC | PRN
  Administered 2023-06-08: 40 mg via INTRAVENOUS

## 2023-06-08 MED ORDER — PROPOFOL 500 MG/50ML IV EMUL
INTRAVENOUS | Status: AC
  Filled 2023-06-08: qty 50

## 2023-06-08 MED ORDER — PROPOFOL 10 MG/ML IV BOLUS
INTRAVENOUS | Status: DC | PRN
  Administered 2023-06-08: 30 mg via INTRAVENOUS
  Administered 2023-06-08: 20 mg via INTRAVENOUS

## 2023-06-08 NOTE — Transfer of Care (Signed)
 Immediate Anesthesia Transfer of Care Note  Patient: Jordan Smith  Procedure(s) Performed: COLONOSCOPY BIOPSY, SKIN, SUBCUTANEOUS TISSUE, OR MUCOUS MEMBRANE  Patient Location: Endoscopy Unit  Anesthesia Type:MAC  Level of Consciousness: sedated  Airway & Oxygen Therapy: Patient Spontanous Breathing and Patient connected to face mask oxygen  Post-op Assessment: Report given to RN and Post -op Vital signs reviewed and stable  Post vital signs: Reviewed and stable  Last Vitals:  Vitals Value Taken Time  BP    Temp 36.6 C 06/08/23 1417  Pulse 95 06/08/23 1417  Resp 14 06/08/23 1417  SpO2 100 % 06/08/23 1417  Vitals shown include unfiled device data.  Last Pain:  Vitals:   06/08/23 1417  TempSrc: Temporal  PainSc:       Patients Stated Pain Goal: 0 (06/07/23 1026)  Complications: No notable events documented.

## 2023-06-08 NOTE — Anesthesia Preprocedure Evaluation (Signed)
 Anesthesia Evaluation  Patient identified by MRN, date of birth, ID band Patient awake    Reviewed: Allergy & Precautions, NPO status , Patient's Chart, lab work & pertinent test results, reviewed documented beta blocker date and time   History of Anesthesia Complications Negative for: history of anesthetic complications  Airway Mallampati: II  TM Distance: >3 FB Neck ROM: Full    Dental  (+) Dental Advisory Given, Teeth Intact   Pulmonary asthma , sleep apnea and Continuous Positive Airway Pressure Ventilation , former smoker   Pulmonary exam normal        Cardiovascular hypertension, Pt. on home beta blockers and Pt. on medications Normal cardiovascular exam     Neuro/Psych negative neurological ROS  negative psych ROS   GI/Hepatic negative GI ROS, Neg liver ROS,,,  Endo/Other   Obesity   Renal/GU negative Renal ROS     Musculoskeletal negative musculoskeletal ROS (+)    Abdominal   Peds  Hematology  Polycythemia    Anesthesia Other Findings   Reproductive/Obstetrics                             Anesthesia Physical Anesthesia Plan  ASA: 2  Anesthesia Plan: MAC   Post-op Pain Management: Minimal or no pain anticipated   Induction:   PONV Risk Score and Plan: 1 and Propofol  infusion and Treatment may vary due to age or medical condition  Airway Management Planned: Natural Airway and Simple Face Mask  Additional Equipment: None  Intra-op Plan:   Post-operative Plan:   Informed Consent: I have reviewed the patients History and Physical, chart, labs and discussed the procedure including the risks, benefits and alternatives for the proposed anesthesia with the patient or authorized representative who has indicated his/her understanding and acceptance.       Plan Discussed with: CRNA and Anesthesiologist  Anesthesia Plan Comments:        Anesthesia Quick  Evaluation

## 2023-06-08 NOTE — Progress Notes (Signed)
    Progress Note   Subjective  Hospital day #4 Chief Complaint: Abdominal pain and abnormal imaging concerning for enterocolitis versus potential IBD  Checked on the patient this morning.  He tells me prepped well overnight and is running clear.  Having all those bowel movements did help with his abdominal distention but he continues with some discomfort and cramping that comes in waves.  Aware of plans for colonoscopy and has no questions other than wants his wife to be present afterwards so she can ask her own questions and hear results.   Objective   Vital signs in last 24 hours: Temp:  [98.3 F (36.8 C)-99.1 F (37.3 C)] 98.5 F (36.9 C) (04/21 1144) Pulse Rate:  [82-88] 82 (04/21 1144) Resp:  [16-20] 16 (04/21 1144) BP: (141-161)/(90-106) 141/90 (04/21 1144) SpO2:  [99 %-100 %] 100 % (04/21 1144) Last BM Date : 06/08/23 General:    white male in NAD Heart:  Regular rate and rhythm; no murmurs Lungs: Respirations even and unlabored, lungs CTA bilaterally Abdomen:  Soft, mild generalized ttp and nondistended. Normal bowel sounds. Psych:  Cooperative. Normal mood and affect.  Intake/Output from previous day: No intake/output data recorded. Intake/Output this shift: Total I/O In: 240 [P.O.:240] Out: -   Lab Results: Recent Labs    06/06/23 0449 06/08/23 0424  WBC 6.5 6.8  HGB 15.2 15.7  HCT 45.0 45.1  PLT 171 187   BMET Recent Labs    06/06/23 0449 06/08/23 0424  NA 133* 132*  K 3.5 4.0  CL 99 97*  CO2 23 23  GLUCOSE 90 94  BUN 8 <5*  CREATININE 0.75 0.87  CALCIUM 8.1* 8.2*   LFT Recent Labs    06/08/23 0424  PROT 5.7*  ALBUMIN 3.1*  AST 29  ALT 28  ALKPHOS 51  BILITOT 1.3*   PT/INR Recent Labs    06/06/23 0449  LABPROT 14.0  INR 1.1    Assessment / Plan:   Assessment: 1.  Abdominal pain: See below, GI path panel and C. difficile negative 2.  Abnormal imaging: CT/18/25 concerning for enterocolitis versus potential IBD (appearance  unchanged from CT 4 days prior), increased simple fluid density ascites, overall thought to be infectious etiology versus first presentation of underlying IBD; consider IBD vs infectious cause 3.  OSA 4.  Hepatic steatosis 5.  History of colon polyps (previous TA's)  Plan: 1.  Colonoscopy today with hope to intubate the TI if possible over concern for possible IBD 2.  Patient to remain n.p.o. until after time of procedure 3.  Please await further recommendations from Dr. Yvone Herd later today 4.  Continue antibiotics  Thank you for your kind consultation, we will continue to follow.   LOS: 3 days   Graciella Lavender  06/08/2023, 11:47 AM

## 2023-06-08 NOTE — Plan of Care (Signed)

## 2023-06-08 NOTE — Progress Notes (Signed)
   06/08/23 2236  BiPAP/CPAP/SIPAP  BiPAP/CPAP/SIPAP Pt Type Adult  Reason BIPAP/CPAP not in use Non-compliant (Pt refusing cpap for the night)

## 2023-06-08 NOTE — Progress Notes (Signed)
 PROGRESS NOTE    Jordan Smith  RUE:[id number] DOB: October 25, 1975 DOA: 06/05/2023 PCP: Maryln Sober, PA-C    Brief Narrative:  Jordan Smith is a 48 y.o. male with medical history significant of hypertension, hyperlipidemia, polycythemia, sleep apnea on CPAP who presented to the emergency department with abdominal pain, dyspepsia, nausea and vomiting.  He has had multiple episodes of diarrhea, but seems to be constipated in recent days.  He has been having symptoms for about a month and a half.  Possible sick contacts at home with his wife and daughter having similar symptoms.   Assessment and Plan: Enterocolitis -c diff negative -GI pathogen panel negative Continue ceftriaxone  2 g every 24 hours. Continue metronidazole  500 mg IVPB q 12 hr. -colonoscopy on Monday per GI    Hypokalemia -replete along with Mg     Hyponatremia -trend     Class 1 obesity Estimated body mass index is 33.79 kg/m as calculated from the following:   Height as of this encounter: 6' (1.829 m).   Weight as of this encounter: 113 kg.      Elevated LFTs -trend     Mild intermittent asthma without complication Bronchodilators as needed.   DVT prophylaxis:     Code Status: Full Code   Disposition Plan:  Level of care: Med-Surg Status is: Inpatient     Consultants:  GI   Subjective: Awaiting colonoscopy  Objective: Vitals:   06/07/23 0429 06/07/23 1716 06/07/23 2124 06/08/23 0434  BP: (!) 152/103 (!) 161/98 (!) 157/106 (!) 146/99  Pulse: 91 87 88 87  Resp: 18 18 20 18   Temp: 98.9 F (37.2 C) 99.1 F (37.3 C) 98.8 F (37.1 C) 98.3 F (36.8 C)  TempSrc:  Oral Oral Oral  SpO2: 97% 100% 100% 99%  Weight:      Height:        Intake/Output Summary (Last 24 hours) at 06/08/2023 0955 Last data filed at 06/08/2023 0800 Gross per 24 hour  Intake 240 ml  Output --  Net 240 ml   Filed Weights   06/05/23 0358  Weight: 113 kg    Examination:   General: Appearance:     Obese male in no acute distress     Lungs:     respirations unlabored  Heart:    Normal heart rate.    MS:   All extremities are intact.    Neurologic:   Awake, alert       Data Reviewed: I have personally reviewed following labs and imaging studies  CBC: Recent Labs  Lab 06/01/23 1910 06/03/23 1421 06/05/23 0411 06/06/23 0449 06/08/23 0424  WBC 8.3 7.2 6.4 6.5 6.8  NEUTROABS  --  5.8  --   --   --   HGB 17.4* 17.2* 15.8 15.2 15.7  HCT 51.1 51.5 44.6 45.0 45.1  MCV 96.4 98.8 93.9 97.4 97.0  PLT 254 245.0 192 171 187   Basic Metabolic Panel: Recent Labs  Lab 06/01/23 1910 06/03/23 1421 06/05/23 0411 06/06/23 0449 06/08/23 0424  NA 138 137 131* 133* 132*  K 4.5 3.9 3.4* 3.5 4.0  CL 97* 93* 92* 99 97*  CO2 25 32 27 23 23   GLUCOSE 122* 107* 95 90 94  BUN 8 9 11 8  <5*  CREATININE 0.93 1.01 0.93 0.75 0.87  CALCIUM 9.7 9.8 9.0 8.1* 8.2*  MG  --  1.6  --   --   --    GFR: Estimated Creatinine Clearance: 133.4 mL/min (by C-G formula based  on SCr of 0.87 mg/dL). Liver Function Tests: Recent Labs  Lab 06/01/23 1910 06/03/23 1421 06/05/23 0411 06/06/23 0449 06/08/23 0424  AST 98* 128* 57* 68* 29  ALT 77* 64* 43 43 28  ALKPHOS 80 69 55 46 51  BILITOT 1.5* 1.3* 1.6* 1.4* 1.3*  PROT 7.6 7.4 6.3* 5.7* 5.7*  ALBUMIN 4.1 4.6 4.0 3.1* 3.1*   Recent Labs  Lab 06/01/23 1910 06/05/23 0411  LIPASE 37 32   No results for input(s): "AMMONIA" in the last 168 hours. Coagulation Profile: Recent Labs  Lab 06/06/23 0449  INR 1.1   Cardiac Enzymes: No results for input(s): "CKTOTAL", "CKMB", "CKMBINDEX", "TROPONINI" in the last 168 hours. BNP (last 3 results) No results for input(s): "PROBNP" in the last 8760 hours. HbA1C: No results for input(s): "HGBA1C" in the last 72 hours. CBG: No results for input(s): "GLUCAP" in the last 168 hours. Lipid Profile: No results for input(s): "CHOL", "HDL", "LDLCALC", "TRIG", "CHOLHDL", "LDLDIRECT" in the last 72  hours. Thyroid  Function Tests: No results for input(s): "TSH", "T4TOTAL", "FREET4", "T3FREE", "THYROIDAB" in the last 72 hours. Anemia Panel: No results for input(s): "VITAMINB12", "FOLATE", "FERRITIN", "TIBC", "IRON", "RETICCTPCT" in the last 72 hours.  Sepsis Labs: No results for input(s): "PROCALCITON", "LATICACIDVEN" in the last 168 hours.  Recent Results (from the past 240 hours)  C Difficile Quick Screen w PCR reflex     Status: None   Collection Time: 06/05/23  3:05 AM   Specimen: STOOL  Result Value Ref Range Status   C Diff antigen NEGATIVE NEGATIVE Final   C Diff toxin NEGATIVE NEGATIVE Final   C Diff interpretation No C. difficile detected.  Final    Comment: Performed at [facility], [address], [place], [place] [address]  Blood culture (routine x 2)     Status: None (Preliminary result)   Collection Time: 06/05/23  7:46 AM   Specimen: BLOOD RIGHT HAND  Result Value Ref Range Status   Specimen Description   Final    BLOOD RIGHT HAND Performed at [facility], [address], [place], [place] [address]    Special Requests   Final    Blood Culture results may not be optimal due to an inadequate volume of blood received in culture bottles BOTTLES DRAWN AEROBIC AND ANAEROBIC Performed at [facility], [address] [place], [place] [address]    Culture   Final    NO GROWTH 3 DAYS Performed at [facility], [address] [place], [place] [address]    Report Status PENDING  Incomplete  Blood culture (routine x 2)     Status: None (Preliminary result)   Collection Time: 06/05/23  7:51 AM   Specimen: BLOOD  Result Value Ref Range Status   Specimen Description   Final    BLOOD LEFT ANTECUBITAL Performed at [facility], [address] [place], [place] [address]    Special Requests   Final    Blood Culture results may not be optimal due to an inadequate volume of blood received in culture  bottles BOTTLES DRAWN AEROBIC AND ANAEROBIC Performed at [facility], [address], [place], [place] [address]    Culture   Final    NO GROWTH 3 DAYS Performed at [facility], [address] [place], [place] [address]    Report Status PENDING  Incomplete  Gastrointestinal Panel by PCR , Stool     Status: None   Collection  Time: 06/06/23  3:05 AM   Specimen: STOOL  Result Value Ref Range Status   Campylobacter species NOT DETECTED NOT DETECTED Final   Plesimonas shigelloides NOT DETECTED NOT DETECTED Final   Salmonella species NOT DETECTED NOT DETECTED Final   Yersinia enterocolitica NOT DETECTED NOT DETECTED Final   Vibrio species NOT DETECTED NOT DETECTED Final   Vibrio cholerae NOT DETECTED NOT DETECTED Final   Enteroaggregative E coli (EAEC) NOT DETECTED NOT DETECTED Final   Enteropathogenic E coli (EPEC) NOT DETECTED NOT DETECTED Final   Enterotoxigenic E coli (ETEC) NOT DETECTED NOT DETECTED Final   Shiga like toxin producing E coli (STEC) NOT DETECTED NOT DETECTED Final   Shigella/Enteroinvasive E coli (EIEC) NOT DETECTED NOT DETECTED Final   Cryptosporidium NOT DETECTED NOT DETECTED Final   Cyclospora cayetanensis NOT DETECTED NOT DETECTED Final   Entamoeba histolytica NOT DETECTED NOT DETECTED Final   Giardia lamblia NOT DETECTED NOT DETECTED Final   Adenovirus F40/41 NOT DETECTED NOT DETECTED Final   Astrovirus NOT DETECTED NOT DETECTED Final   Norovirus GI/GII NOT DETECTED NOT DETECTED Final   Rotavirus A NOT DETECTED NOT DETECTED Final   Sapovirus (I, II, IV, and V) NOT DETECTED NOT DETECTED Final    Comment: Performed at [facility], [address], [place], [place] [address]         Radiology Studies: No results found.       Scheduled Meds:  ondansetron  (ZOFRAN ) IV  4 mg Intravenous Q6H   pantoprazole   40 mg Oral BID   Continuous Infusions:  cefTRIAXone  (ROCEPHIN )  IV 2 g (06/08/23 0828)   metronidazole  500 mg  (06/08/23 0909)     LOS: 3 days    Time spent: 45 minutes spent on chart review, discussion with nursing staff, consultants, updating family and interview/physical exam; more than 50% of that time was spent in counseling and/or coordination of care.    Enrigue Harvard, DO [facility] Available via Epic secure chat 7am-7pm After these hours, please refer to coverage provider listed on [email or web address] 06/08/2023, 9:55 AM

## 2023-06-08 NOTE — H&P (Signed)
 [facility] History and Physical   Primary Care Physician:  Shepperson, Kirstin, PA-C   Reason for Procedure:  Nausea, vomiting, diarrhea, abnormal CT imaging concerning for enterocolitis versus potential IBD  Plan:    Colonoscopy   HPI: Jordan Smith is a 48 y.o. male undergoing colonoscopy for evaluation of symptoms of nausea, vomiting, diarrhea and CT scan findings concerning for enterocolitis versus potential IBD.  Patient was previously evaluated with EGD for symptoms of nausea.  Just over 1 week ago he developed the abrupt onset of abdominal cramping and diarrhea.  Stool studies negative for enteric pathogens.  C. difficile negative.  Fecal calprotectin result pending. Last colonoscopy 04/2022 showed diminutive polyps with no evidence of inflammation.  Prior to procedure today patient reports that his nausea and vomiting are improved on antiemetics.  He continues to experience some abdominal tenderness.   Past Medical History:  Diagnosis Date   Hyperlipidemia    Hypertension    Polycythemia    gives blood evey 2 weeks   Sleep apnea    c pap    Past Surgical History:  Procedure Laterality Date   BIOPSY  05/08/2022   Procedure: BIOPSY;  Surgeon: Normie Becton., MD;  Location: [facility];  Service: Gastroenterology;;   Ritta Chessman RELEASE Right 2010   COLONOSCOPY WITH PROPOFOL  N/A 05/08/2022   Procedure: COLONOSCOPY WITH PROPOFOL ;  Surgeon: Normie Becton., MD;  Location: [facility];  Service: Gastroenterology;  Laterality: N/A;   ENDOSCOPIC MUCOSAL RESECTION N/A 05/08/2022   Procedure: ENDOSCOPIC MUCOSAL RESECTION;  Surgeon: Brice Campi Albino Alu., MD;  Location: [facility];  Service: Gastroenterology;  Laterality: N/A;   HERNIA REPAIR Bilateral    POLYPECTOMY  05/08/2022   Procedure: POLYPECTOMY;  Surgeon: Mansouraty, Albino Alu., MD;  Location: [facility];  Service: Gastroenterology;;    Prior to Admission medications   Medication Sig  Start Date End Date Taking? Authorizing Provider  CARAFATE  1 g tablet Take sucralfate  tablets 1 gram PO QID for 2 weeks. 05/20/23  Yes Cirigliano, Vito V, DO  ciprofloxacin  (CIPRO ) 500 MG tablet Take 1 tablet (500 mg total) by mouth 2 (two) times daily for 7 days. 06/02/23 06/09/23 Yes May, Deanna J, NP  metroNIDAZOLE  (FLAGYL ) 500 MG tablet Take 1 tablet (500 mg total) by mouth 3 (three) times daily for 7 days. 06/02/23 06/09/23 Yes May, Deanna J, NP  ondansetron  (ZOFRAN -ODT) 4 MG disintegrating tablet Take 1 tablet (4 mg total) by mouth every 8 (eight) hours as needed for nausea or vomiting. 06/02/23  Yes Royann Cords, PA  tadalafil (CIALIS) 10 MG tablet Take 10 mg by mouth daily as needed for erectile dysfunction. 12/05/21  Yes [provider]  pantoprazole  (PROTONIX ) 40 MG tablet Take 1 tablet (40 mg total) by mouth 2 (two) times daily for 60 days, THEN 1 tablet (40 mg total) daily. Take Protonix  (pantoprazole ) 40 mg PO BID for 8 weeks, then reduce to 40 mg daily if upper GI. 05/20/23 09/17/23  Cirigliano, Vito V, DO  predniSONE  (STERAPRED UNI-PAK 21 TAB) 10 MG (21) TBPK tablet Take by mouth daily. Take 6 tabs by mouth daily  for 2 days, then 5 tabs for 2 days, then 4 tabs for 2 days, then 3 tabs for 2 days, 2 tabs for 2 days, then 1 tab by mouth daily for 2 days Patient not taking: Reported on 06/05/2023 06/02/23   Royann Cords, PA  testosterone cypionate (DEPOTESTOSTERONE CYPIONATE) 200 MG/ML injection SMARTSIG:0.5 Milliliter(s) IM Twice a Week Patient not taking:  Reported on 06/05/2023 02/03/23   [provider]    Current Facility-Administered Medications  Medication Dose Route Frequency Provider Last Rate Last Admin   [MAR Hold] acetaminophen  (TYLENOL ) tablet 650 mg  650 mg Oral Q6H PRN Danice Dural, MD       Or   [facility] Hold] acetaminophen  (TYLENOL ) suppository 650 mg  650 mg Rectal Q6H PRN Danice Dural, MD       [facility] Hold] cefTRIAXone  (ROCEPHIN ) 2 g in sodium  chloride 0.9 % 100 mL IVPB  2 g Intravenous Q24H Danice Dural, MD 200 mL/hr at 06/08/23 0828 2 g at 06/08/23 0828   [MAR Hold] dicyclomine  (BENTYL ) capsule 10 mg  10 mg Oral QID PRN Arlee Bellows, NP   10 mg at 06/06/23 1524   [MAR Hold] methocarbamol  (ROBAXIN ) injection 500 mg  500 mg Intravenous Q6H PRN Katrina Parma U, DO   500 mg at 06/08/23 0515   [MAR Hold] metroNIDAZOLE  (FLAGYL ) IVPB 500 mg  500 mg Intravenous Q12H Danice Dural, MD 100 mL/hr at 06/08/23 0909 500 mg at 06/08/23 0909   [MAR Hold] ondansetron  (ZOFRAN ) tablet 4 mg  4 mg Oral Q6H PRN Mansouraty, Gabriel Jr., MD       Or   [facility] Hold] ondansetron  (ZOFRAN ) injection 4 mg  4 mg Intravenous Q6H PRN Mansouraty, Gabriel Jr., MD   4 mg at 06/07/23 0435   [MAR Hold] pantoprazole  (PROTONIX ) EC tablet 40 mg  40 mg Oral BID Mansouraty, Gabriel Jr., MD   40 mg at 06/08/23 1019   [MAR Hold] traMADol  (ULTRAM ) tablet 50 mg  50 mg Oral Q6H PRN Katrina Parma U, DO   50 mg at 06/08/23 0272    Allergies as of 06/05/2023 - Review Complete 06/05/2023  Allergen Reaction Noted   Meperidine Anaphylaxis 04/21/2017    Family History  Problem Relation Age of Onset   Aneurysm Mother    Hypothyroidism Mother    Vitamin D deficiency Sister    Cancer Maternal Aunt    Cancer Maternal Uncle    Glaucoma Maternal Grandfather    Colon cancer Neg Hx    Colon polyps Neg Hx    Crohn's disease Neg Hx    Esophageal cancer Neg Hx    Rectal cancer Neg Hx    Stomach cancer Neg Hx    Ulcerative colitis Neg Hx     Social History   Socioeconomic History   Marital status: Married    Spouse name: Not on file   Number of children: Not on file   Years of education: Not on file   Highest education level: Not on file  Occupational History   Not on file  Tobacco Use   Smoking status: Former    Current packs/day: 0.00    Average packs/day: 1 pack/day for 20.0 years (20.0 ttl pk-yrs)    Types: Cigarettes    Start date: 42    Quit date:  2014    Years since quitting: 11.3    Passive exposure: Never   Smokeless tobacco: Former  Building services engineer status: Former  Substance and Sexual Activity   Alcohol use: Yes    Alcohol/week: 1.0 - 2.0 standard drink of alcohol    Types: 1 - 2 Standard drinks or equivalent per week    Comment: social   Drug use: Never   Sexual activity: Yes  Other Topics Concern   Not on file  Social History Narrative   Not on  file   Social Drivers of Health   Financial Resource Strain: Low Risk  (03/17/2023)   Received from [facility]   Overall Financial Resource Strain (CARDIA)    Difficulty of Paying Living Expenses: Not hard at all  Food Insecurity: No Food Insecurity (06/05/2023)   Hunger Vital Sign    Worried About Running Out of Food in the Last Year: Never true    Ran Out of Food in the Last Year: Never true  Transportation Needs: No Transportation Needs (06/05/2023)   PRAPARE - Administrator, Civil Service (Medical): No    Lack of Transportation (Non-Medical): No  Physical Activity: Sufficiently Active (01/12/2023)   Received from [facility]   Exercise Vital Sign    Days of Exercise per Week: 4 days    Minutes of Exercise per Session: 90 min  Stress: Stress Concern Present (01/12/2023)   Received from [facility] - Occupational Stress Questionnaire    Feeling of Stress : Rather much  Social Connections: Somewhat Isolated (01/12/2023)   Received from [facility]   Social Network    How would you rate your social network (family, work, friends)?: Restricted participation with some degree of social isolation  Intimate Partner Violence: Not At Risk (06/05/2023)   Humiliation, Afraid, Rape, and Kick questionnaire    Fear of Current or Ex-Partner: No    Emotionally Abused: No    Physically Abused: No    Sexually Abused: No    Review of Systems:  All other review of systems negative except as mentioned in the  HPI.  Physical Exam: Vital signs BP (!) 160/97   Pulse 86   Temp 97.8 F (36.6 C) (Temporal)   Resp 18   Ht 6' (1.829 m)   Wt 113 kg   SpO2 100%   BMI 33.79 kg/m   General:   Alert,  Well-developed, well-nourished, pleasant and cooperative in NAD Lungs:  Clear throughout to auscultation.   Heart:  Regular rate and rhythm; no murmurs, clicks, rubs,  or gallops. Abdomen:  Soft, diffusely tender to palpation greatest in right upper quadrant and nondistended. Normal bowel sounds.   Neuro/Psych:  Normal mood and affect. A and O x 3  Eugenia Hess, MD [facility]

## 2023-06-08 NOTE — Anesthesia Postprocedure Evaluation (Signed)
 Anesthesia Post Note  Patient: Jordan Smith  Procedure(s) Performed: COLONOSCOPY BIOPSY, SKIN, SUBCUTANEOUS TISSUE, OR MUCOUS MEMBRANE     Patient location during evaluation: Endoscopy Anesthesia Type: MAC Level of consciousness: oriented, awake and alert and awake Pain management: pain level controlled Vital Signs Assessment: post-procedure vital signs reviewed and stable Respiratory status: spontaneous breathing, nonlabored ventilation and respiratory function stable Cardiovascular status: blood pressure returned to baseline and stable Postop Assessment: no headache, no backache and no apparent nausea or vomiting Anesthetic complications: no   No notable events documented.  Last Vitals:  Vitals:   06/08/23 1440 06/08/23 1446  BP: 124/69 127/72  Pulse: 82 83  Resp: 15 11  Temp:    SpO2: 100% 100%    Last Pain:  Vitals:   06/08/23 1446  TempSrc:   PainSc: 0-No pain                 Erin Havers

## 2023-06-08 NOTE — Op Note (Signed)
 [facility] Patient Name: Jordan Smith Procedure Date: 06/08/2023 MRN: [id number] Attending MD: Eugenia Hess , MD, [id number] Date of Birth: 1975/04/28 CSN: [id number] Age: 48 Admit Type: Inpatient Procedure:                Colonoscopy Indications:              Generalized abdominal pain, Abnormal CT of the GI                            tract, Diarrhea Providers:                Eugenia Hess, MD, Suzann Ernst, RN, Judith Novak,                            Technician Referring MD:              Medicines:                Monitored Anesthesia Care Complications:            No immediate complications. Estimated blood loss:                            Minimal. Estimated Blood Loss:     Estimated blood loss was minimal. Procedure:                Pre-Anesthesia Assessment:                           - Prior to the procedure, a History and Physical                            was performed, and patient medications and                            allergies were reviewed. The patient's tolerance of                            previous anesthesia was also reviewed. The risks                            and benefits of the procedure and the sedation                            options and risks were discussed with the patient.                            All questions were answered, and informed consent                            was obtained. Prior Anticoagulants: The patient has                            taken no anticoagulant or antiplatelet agents. ASA                            Grade Assessment: II -  A patient with mild systemic                            disease. After reviewing the risks and benefits,                            the patient was deemed in satisfactory condition to                            undergo the procedure.                           After obtaining informed consent, the colonoscope                            was passed under direct vision. Throughout the                             procedure, the patient's blood pressure, pulse, and                            oxygen saturations were monitored continuously. The                            CF-HQ190L ([id number]) Olympus colonoscope was                            introduced through the anus and advanced to the 20                            cm into the ileum. The colonoscopy was performed                            without difficulty. The patient tolerated the                            procedure well. The quality of the bowel                            preparation was fair. Bowel preparation was                            adequate to assess for overt inflammatory changes                            but not adequate for polyp detection. The terminal                            ileum, ileocecal valve, appendiceal orifice, and                            rectum were photographed. Scope In: 1:47:43 PM Scope Out: 2:07:16 PM Scope Withdrawal Time: 0 hours 17 minutes 44 seconds  Total Procedure Duration: 0 hours 19 minutes 33 seconds  Findings:      The perianal and digital rectal examinations were normal. Pertinent       negatives include normal sphincter tone and no palpable rectal lesions.      A moderate amount of semi-liquid stool was found in the entire colon,       interfering with visualization. Lavage of the area was performed using       copious amounts of sterile water, resulting in clearance with fair       visualization.      The majority of the colonic mucosa was overall normal. The tissue       appeared diffusely mildly congested. Haustral folds did not separate and       flatten robustly with insufflation. There were a few scattered areas of       erythema characterized by pinpoint red spots throughout all segments of       the colon. These tended to be localized in clusters and may represent       mild inflammation versus scope trauma. Biopsies were performed of the       ascending colon,  transverse colon, descending colon and rectosigmoid       colon with cold forceps biopsies for histology.      The terminal ileum was intubated 20 cm. There was a short segment of       scattered erythema and pinpoint red spots immediately proximal to the os       of the ileocecal valve and the terminal ileum. The tissue of the       terminal ileum proximal to this region appeared normal. Biopsies were       performed of the terminal ileum with cold forceps biopsies for       histopathology.      Internal hemorrhoids were found during retroflexion. Impression:               - Preparation of the colon was fair. Stool was                            noted scattered throughout all segments of colon                            and extensively lavaged. Bowel preparation was                            adequate to assess for overt inflammatory changes                            but not adequate for polyp detection.                           - The colonic mucosa generally appeared normal.                            Tissue appeared mildly congested throughout the                            colon. There were scattered areas of pinpoint red  spots throughout all segments of the colon that may                            represent mild inflammation versus scope trauma.                           - The terminal ileum was intubated 20 cm. Most of                            the terminal ileum appeared normal. There was a                            short segment of mildly erythematous red spots                            immediately proximal to the os of the ileocecal                            valve in the TI.                           - Overall, the minimal erythema seen in the colon                            and terminal ileum are mild and nonspecific.                            Differential diagnosis remains consistent with a                            form of infectious  enterocolitis versus mild                            Crohn's disease.                           - Internal hemorrhoids. Moderate Sedation:      Not Applicable - Patient had care per Anesthesia. Recommendation:           - Return patient to hospital ward for ongoing care.                           - Await pathology results. - Further                            recommendations pending results of histopathology.                           - Recommend continuing supportive management with                            antiemetics, pain medication and advancing diet as                            tolerated.                           -  Continue ceftriaxone  and metronidazole                            - Continue pantoprazole                            - Continue dicyclomine  and considered scheduled                            dosing if patient continues to experience abdominal                            pain and cramping Procedure Code(s):        --- Professional ---                           [phone], Colonoscopy, flexible; diagnostic, including                            collection of specimen(s) by brushing or washing,                            when performed (separate procedure) Diagnosis Code(s):        --- Professional ---                           K64.8, Other hemorrhoids                           R10.84, Generalized abdominal pain                           R19.7, Diarrhea, unspecified                           R93.3, Abnormal findings on diagnostic imaging of                            other parts of digestive tract CPT copyright 2022 [employer]. All rights reserved. The codes documented in this report are preliminary and upon coder review may  be revised to meet current compliance requirements. Eugenia Hess, MD 06/08/2023 2:27:14 PM This report has been signed electronically. Number of Addenda: 0

## 2023-06-09 ENCOUNTER — Encounter (HOSPITAL_COMMUNITY): Payer: Self-pay | Admitting: Pediatrics

## 2023-06-09 ENCOUNTER — Other Ambulatory Visit (HOSPITAL_COMMUNITY): Payer: Self-pay

## 2023-06-09 DIAGNOSIS — G4733 Obstructive sleep apnea (adult) (pediatric): Secondary | ICD-10-CM | POA: Diagnosis not present

## 2023-06-09 DIAGNOSIS — R109 Unspecified abdominal pain: Secondary | ICD-10-CM | POA: Diagnosis not present

## 2023-06-09 DIAGNOSIS — K529 Noninfective gastroenteritis and colitis, unspecified: Secondary | ICD-10-CM | POA: Diagnosis not present

## 2023-06-09 DIAGNOSIS — K760 Fatty (change of) liver, not elsewhere classified: Secondary | ICD-10-CM | POA: Diagnosis not present

## 2023-06-09 DIAGNOSIS — R933 Abnormal findings on diagnostic imaging of other parts of digestive tract: Secondary | ICD-10-CM | POA: Diagnosis not present

## 2023-06-09 MED ORDER — DICYCLOMINE HCL 10 MG PO CAPS
10.0000 mg | ORAL_CAPSULE | Freq: Four times a day (QID) | ORAL | 0 refills | Status: DC | PRN
  Filled 2023-06-09: qty 20, 5d supply, fill #0

## 2023-06-09 MED ORDER — ACETAMINOPHEN 325 MG PO TABS: 650.0000 mg | ORAL_TABLET | Freq: Four times a day (QID) | ORAL | Status: DC | PRN

## 2023-06-09 MED ORDER — AMOXICILLIN-POT CLAVULANATE 875-125 MG PO TABS: 1.0000 | ORAL_TABLET | Freq: Two times a day (BID) | ORAL | Status: DC

## 2023-06-09 MED ORDER — AMOXICILLIN-POT CLAVULANATE 875-125 MG PO TABS
1.0000 | ORAL_TABLET | Freq: Two times a day (BID) | ORAL | 0 refills | Status: DC
  Filled 2023-06-09: qty 12, 6d supply, fill #0

## 2023-06-09 MED ORDER — TRAMADOL HCL 50 MG PO TABS
50.0000 mg | ORAL_TABLET | Freq: Four times a day (QID) | ORAL | 0 refills | Status: DC | PRN
  Filled 2023-06-09: qty 20, 5d supply, fill #0

## 2023-06-09 MED ORDER — METHOCARBAMOL 500 MG PO TABS
500.0000 mg | ORAL_TABLET | Freq: Four times a day (QID) | ORAL | 0 refills | Status: DC | PRN
  Filled 2023-06-09: qty 20, 5d supply, fill #0

## 2023-06-09 MED ORDER — METHOCARBAMOL 500 MG PO TABS: 500.0000 mg | ORAL_TABLET | Freq: Four times a day (QID) | ORAL | Status: DC | PRN

## 2023-06-09 NOTE — Discharge Summary (Signed)
 Physician Discharge Summary  Abdikadir Fohl JXB:[id number] DOB: [birth year] DOA: 06/05/2023  PCP: Shepperson, Kirstin, PA-C  Admit date: 06/05/2023 Discharge date: 06/09/2023  Admitted From: home Discharge disposition: home   Recommendations for Outpatient Follow-Up:   Outpatient GI follow up   Discharge Diagnosis:   Principal Problem:   Enterocolitis Active Problems:   Elevated LFTs   Mild intermittent asthma without complication   Hypokalemia   Hyponatremia   Class 1 obesity   Abnormal CT scan, gastrointestinal tract   Bilious vomiting with nausea   Unintentional weight loss    Discharge Condition: Improved.  Diet recommendation: soft  Wound care: None.  Code status: Full.   History of Present Illness:   Jordan Smith is a 48 y.o. male undergoing colonoscopy for evaluation of symptoms of nausea, vomiting, diarrhea and CT scan findings concerning for enterocolitis versus potential IBD.  Patient was previously evaluated with EGD for symptoms of nausea.  Just over 1 week ago he developed the abrupt onset of abdominal cramping and diarrhea.  Stool studies negative for enteric pathogens.  C. difficile negative.  Fecal calprotectin result pending. Last colonoscopy 04/2022 showed diminutive polyps with no evidence of inflammation.  Prior to procedure today patient reports that his nausea and vomiting are improved on antiemetics.  He continues to experience some abdominal tenderness.     Hospital Course by Problem:   Enterocolitis -c diff negative -GI pathogen panel negative PO abx x 10 days today -colonoscopy done and path pending    Hypokalemia -repleted along with Mg     Hyponatremia -mild     Class 1 obesity Estimated body mass index is 33.79 kg/m as calculated from the following:   Height as of this encounter: 6' (1.829 m).   Weight as of this encounter: 113 kg.      Elevated LFTs -outpatient follow up     Mild intermittent asthma without  complication Bronchodilators as needed.      Medical Consultants:      Discharge Exam:   Vitals:   06/08/23 2016 06/09/23 0553  BP: (!) 140/88 (!) 130/92  Pulse: 79 88  Resp: 18 17  Temp: 99.1 F (37.3 C)   SpO2: 99% 97%   Vitals:   06/08/23 1440 06/08/23 1446 06/08/23 2016 06/09/23 0553  BP: 124/69 127/72 (!) 140/88 (!) 130/92  Pulse: 82 83 79 88  Resp: 15 11 18 17   Temp:   99.1 F (37.3 C)   TempSrc:      SpO2: 100% 100% 99% 97%  Weight:      Height:        General exam: Appears calm and comfortable.   The results of significant diagnostics from this hospitalization (including imaging, microbiology, ancillary and laboratory) are listed below for reference.     Procedures and Diagnostic Studies:   CT ABDOMEN PELVIS W CONTRAST Result Date: 06/05/2023 CLINICAL DATA:  48 year old male with abdominal pain for 2-3 weeks with nausea and vomiting. Recently diagnosed with Crohn disease. EXAM: CT ABDOMEN AND PELVIS WITH CONTRAST TECHNIQUE: Multidetector CT imaging of the abdomen and pelvis was performed using the standard protocol following bolus administration of intravenous contrast. RADIATION DOSE REDUCTION: This exam was performed according to the departmental dose-optimization program which includes automated exposure control, adjustment of the mA and/or kV according to patient size and/or use of iterative reconstruction technique. CONTRAST:  OMNIPAQUE  IOHEXOL  300 MG/ML  SOLN COMPARISON:  Recent CTs Abdomen and Pelvis 06/01/2023 and earlier. FINDINGS: Lower chest:  Stable and negative aside from minor lung base atelectasis. Hepatobiliary: Increased perihepatic ascites with simple fluid density, small volume (series 2, image 20). Underlying hepatic steatosis and gallbladder appear stable. Pancreas: Negative. Spleen: Negative. Adrenals/Urinary Tract: Negative adrenal glands and kidneys. Diminutive urinary bladder with nonspecific wall thickening and mild perivesical  inflammatory stranding on series 2, image 82. No urinary calculus. Stomach/Bowel: Fluid in the large bowel to the rectum. Nondilated large and small bowel loops, but inflamed, thickened distal small bowel in the right lower quadrant very similar to the CT Abdomen and Pelvis 4 days ago (coronal image 86). Mesenteric inflammation and small volume of mesenteric free fluid around the affected loops. Gradual transition to more normal appearing upstream small bowel which is mildly gas distended in the anterior central abdomen. Other jejunum loops are decompressed and within normal limits. Small volume of free fluid also at the root of the small bowel mesentery. Stomach and duodenum decompressed. No pneumoperitoneum. Vascular/Lymphatic: Aortoiliac calcified atherosclerosis. Normal caliber abdominal aorta. Major arterial structures are patent. Portal venous system is patent. No lymphadenopathy identified. Reproductive: Negative. Other: Low-density free fluid continues layering in the anterior lower peritoneal cavity. No free fluid in the deep pelvis. Musculoskeletal: Stable and negative. IMPRESSION: 1. Ongoing Enterocolitis, appearance not significantly changed from CT four days ago. Increased simple fluid density ascites since that time. No evidence of bowel obstruction or perforation. 2. Diminutive urinary bladder appears inflamed. Consider superimposed Cystitis or UTI. 3. Hepatic steatosis. 4.  Aortic Atherosclerosis (ICD10-I70.0). Electronically Signed   By: Marlise Simpers M.D.   On: 06/05/2023 06:51     Labs:   Basic Metabolic Panel: Recent Labs  Lab 06/03/23 1421 06/05/23 0411 06/06/23 0449 06/08/23 0424  NA 137 131* 133* 132*  K 3.9 3.4* 3.5 4.0  CL 93* 92* 99 97*  CO2 32 27 23 23   GLUCOSE 107* 95 90 94  BUN 9 11 8  <5*  CREATININE 1.01 0.93 0.75 0.87  CALCIUM 9.8 9.0 8.1* 8.2*  MG 1.6  --   --   --    GFR Estimated Creatinine Clearance: 133.4 mL/min (by C-G formula based on SCr of 0.87 mg/dL). Liver  Function Tests: Recent Labs  Lab 06/03/23 1421 06/05/23 0411 06/06/23 0449 06/08/23 0424  AST 128* 57* 68* 29  ALT 64* 43 43 28  ALKPHOS 69 55 46 51  BILITOT 1.3* 1.6* 1.4* 1.3*  PROT 7.4 6.3* 5.7* 5.7*  ALBUMIN 4.6 4.0 3.1* 3.1*   Recent Labs  Lab 06/05/23 0411  LIPASE 32   No results for input(s): "AMMONIA" in the last 168 hours. Coagulation profile Recent Labs  Lab 06/06/23 0449  INR 1.1    CBC: Recent Labs  Lab 06/03/23 1421 06/05/23 0411 06/06/23 0449 06/08/23 0424  WBC 7.2 6.4 6.5 6.8  NEUTROABS 5.8  --   --   --   HGB 17.2* 15.8 15.2 15.7  HCT 51.5 44.6 45.0 45.1  MCV 98.8 93.9 97.4 97.0  PLT 245.0 192 171 187   Cardiac Enzymes: No results for input(s): "CKTOTAL", "CKMB", "CKMBINDEX", "TROPONINI" in the last 168 hours. BNP: Invalid input(s): "POCBNP" CBG: No results for input(s): "GLUCAP" in the last 168 hours. D-Dimer No results for input(s): "DDIMER" in the last 72 hours. Hgb A1c No results for input(s): "HGBA1C" in the last 72 hours. Lipid Profile No results for input(s): "CHOL", "HDL", "LDLCALC", "TRIG", "CHOLHDL", "LDLDIRECT" in the last 72 hours. Thyroid  function studies No results for input(s): "TSH", "T4TOTAL", "T3FREE", "THYROIDAB" in the last 72  hours.  Invalid input(s): "FREET3" Anemia work up No results for input(s): "VITAMINB12", "FOLATE", "FERRITIN", "TIBC", "IRON", "RETICCTPCT" in the last 72 hours. Microbiology Recent Results (from the past 240 hours)  C Difficile Quick Screen w PCR reflex     Status: None   Collection Time: 06/05/23  3:05 AM   Specimen: STOOL  Result Value Ref Range Status   C Diff antigen NEGATIVE NEGATIVE Final   C Diff toxin NEGATIVE NEGATIVE Final   C Diff interpretation No C. difficile detected.  Final    Comment: Performed at [facility], [address] [place], [place] [address]  Blood culture (routine x 2)     Status: None (Preliminary result)   Collection Time: 06/05/23  7:46  AM   Specimen: BLOOD RIGHT HAND  Result Value Ref Range Status   Specimen Description   Final    BLOOD RIGHT HAND Performed at [facility], [address] [place], [place] [address]    Special Requests   Final    Blood Culture results may not be optimal due to an inadequate volume of blood received in culture bottles BOTTLES DRAWN AEROBIC AND ANAEROBIC Performed at [facility], [address], [place], [place] [address]    Culture   Final    NO GROWTH 4 DAYS Performed at [facility], [address] [place], [place] [address]    Report Status PENDING  Incomplete  Blood culture (routine x 2)     Status: None (Preliminary result)   Collection Time: 06/05/23  7:51 AM   Specimen: BLOOD  Result Value Ref Range Status   Specimen Description   Final    BLOOD LEFT ANTECUBITAL Performed at [facility], [address] [place], [place] [address]    Special Requests   Final    Blood Culture results may not be optimal due to an inadequate volume of blood received in culture bottles BOTTLES DRAWN AEROBIC AND ANAEROBIC Performed at [facility], [address] [place], [place] [address]    Culture   Final    NO GROWTH 4 DAYS Performed at [facility], [address], [place], [place] [address]    Report Status PENDING  Incomplete  Gastrointestinal Panel by PCR , Stool     Status: None   Collection Time: 06/06/23  3:05 AM   Specimen: STOOL  Result Value Ref Range Status   Campylobacter species NOT DETECTED NOT DETECTED Final   Plesimonas shigelloides NOT DETECTED NOT DETECTED Final   Salmonella species NOT DETECTED NOT DETECTED Final   Yersinia enterocolitica NOT DETECTED NOT DETECTED Final   Vibrio species NOT DETECTED NOT DETECTED Final   Vibrio cholerae NOT DETECTED NOT DETECTED Final   Enteroaggregative E coli (EAEC) NOT DETECTED NOT DETECTED Final   Enteropathogenic E coli (EPEC) NOT DETECTED NOT  DETECTED Final   Enterotoxigenic E coli (ETEC) NOT DETECTED NOT DETECTED Final   Shiga like toxin producing E coli (STEC) NOT DETECTED NOT DETECTED Final   Shigella/Enteroinvasive E coli (EIEC) NOT DETECTED NOT DETECTED Final   Cryptosporidium NOT DETECTED NOT DETECTED Final   Cyclospora cayetanensis NOT DETECTED NOT DETECTED Final   Entamoeba histolytica NOT DETECTED NOT DETECTED Final   Giardia lamblia NOT DETECTED NOT DETECTED Final   Adenovirus F40/41 NOT DETECTED NOT DETECTED Final   Astrovirus NOT DETECTED NOT DETECTED Final   Norovirus GI/GII NOT DETECTED NOT DETECTED Final   Rotavirus A NOT DETECTED NOT DETECTED  Final   Sapovirus (I, II, IV, and V) NOT DETECTED NOT DETECTED Final    Comment: Performed at [facility], [address] [place], [place] [address]  Calprotectin, Fecal     Status: None   Collection Time: 06/06/23  3:05 AM   Specimen: STOOL  Result Value Ref Range Status   Calprotectin, Fecal 39 0 - 120 ug/g Final    Comment: (NOTE) Concentration     Interpretation   Follow-Up < 5 - 50 ug/g     Normal           None >50 -120 ug/g     Borderline       Re-evaluate in 4-6 weeks    >120 ug/g     Abnormal         Repeat as clinically                                   indicated Performed At: [facility] [address] [place], [place] [id number] Pearlean Botts MD XB:[id number]      Discharge Instructions:   Discharge Instructions     Discharge instructions   Complete by: As directed    Soft diet Bowel regimen while on pain meds No driving while taking pain meds   Increase activity slowly   Complete by: As directed       Allergies as of 06/09/2023       Reactions   Dilaudid  [hydromorphone ] Other (See Comments)   hallucinations   Meperidine Anaphylaxis   Other Reaction(s): Other (See Comments) Happened as a child, pt does not remember reaction.    Dilaudid  [hydromorphone  Hcl]    Hallucinations        Medication List     STOP  taking these medications    Carafate  1 g tablet Generic drug: sucralfate    ciprofloxacin  500 MG tablet Commonly known as: Cipro    metroNIDAZOLE  500 MG tablet Commonly known as: Flagyl    predniSONE  10 MG (21) Tbpk tablet Commonly known as: STERAPRED UNI-PAK 21 TAB   testosterone cypionate 200 MG/ML injection Commonly known as: DEPOTESTOSTERONE CYPIONATE       TAKE these medications    acetaminophen  325 MG tablet Commonly known as: TYLENOL  Take 2 tablets (650 mg total) by mouth every 6 (six) hours as needed for mild pain (pain score 1-3) (or Fever >/= 101).   amoxicillin -clavulanate 875-125 MG tablet Commonly known as: AUGMENTIN  Take 1 tablet by mouth every 12 (twelve) hours.   dicyclomine  10 MG capsule Commonly known as: BENTYL  Take 1 capsule (10 mg total) by mouth 4 (four) times daily as needed for spasms.   methocarbamol  500 MG tablet Commonly known as: ROBAXIN  Take 1 tablet (500 mg total) by mouth every 6 (six) hours as needed for muscle spasms.   ondansetron  4 MG disintegrating tablet Commonly known as: ZOFRAN -ODT Take 1 tablet (4 mg total) by mouth every 8 (eight) hours as needed for nausea or vomiting.   pantoprazole  40 MG tablet Commonly known as: PROTONIX  Take 1 tablet (40 mg total) by mouth 2 (two) times daily for 60 days, THEN 1 tablet (40 mg total) daily. Take Protonix  (pantoprazole ) 40 mg PO BID for 8 weeks, then reduce to 40 mg daily if upper GI. Start taking on: May 20, 2023   tadalafil 10 MG tablet Commonly known as: CIALIS Take 10 mg by mouth daily as needed for erectile dysfunction.   traMADol   50 MG tablet Commonly known as: ULTRAM  Take 1 tablet (50 mg total) by mouth every 6 (six) hours as needed for moderate pain (pain score 4-6) or severe pain (pain score 7-10).        Follow-up Information     Shepperson, Kirstin, PA-C Follow up.   Specialty: Family Medicine Contact information: [address] [place]  [address] [phone]                  Time coordinating discharge: 45 min  Signed:  Enrigue Harvard DO  [facility] 06/09/2023, 10:36 AM

## 2023-06-09 NOTE — TOC Progression Note (Signed)
 Transition of Care [facility]) - Progression Note    Patient Details  Name: Jordan Smith MRN: [id number] Date of Birth: 11/13/1975  [facility] CM/SW Contact  Kathryn Parish, RN Phone Number: 06/09/2023, 12:05 PM  Clinical Narrative:    NCM requested to speak with patient about transportation to [facility] since he was [facility] [employer]. NCM asked if he has transportation to get him to [facility] and he stated yes. Patients wife will transport to [facility] to get vehicle.   Expected Discharge Plan: Home/Self Care Barriers to Discharge: No Barriers Identified  Expected Discharge Plan and Services   Discharge Planning Services: NA Post Acute Care Choice: NA Living arrangements for the past 2 months: Single Family Home Expected Discharge Date: 06/09/23               DME Arranged: N/A DME Agency: NA       HH Arranged: NA HH Agency: NA         Social Determinants of Health (SDOH) Interventions SDOH Screenings   Food Insecurity: No Food Insecurity (06/05/2023)  Housing: Low Risk  (06/05/2023)  Transportation Needs: No Transportation Needs (06/05/2023)  Utilities: Not At Risk (06/05/2023)  Depression (PHQ2-9): Low Risk  (03/22/2019)  Financial Resource Strain: Low Risk  (03/17/2023)   Received from [facility]  Physical Activity: Sufficiently Active (01/12/2023)   Received from [facility]  Social Connections: Somewhat Isolated (01/12/2023)   Received from [facility]  Stress: Stress Concern Present (01/12/2023)   Received from [facility]  Tobacco Use: Medium Risk (06/08/2023)    Readmission Risk Interventions     No data to display

## 2023-06-09 NOTE — Progress Notes (Signed)
    Progress Note   Subjective  Hospital day #5 Chief Complaint: Abdominal pain and abnormal imaging concerning for enterocolitis versus potential IBD  Status post colonoscopy yesterday with biopsies pending.  This morning, patient reports that his pain is well-controlled.  He tells me that the Robaxin  actually helps to control his pain better than the Dicyclomine .  This in addition to Tramadol  as needed helps him to feel "normal".  He is still requiring Zofran  occasionally.  He is ready to go home.  He ate a normal meal today.   Objective   Vital signs in last 24 hours: Temp:  [97.8 F (36.6 C)-99.1 F (37.3 C)] 99.1 F (37.3 C) (04/21 2016) Pulse Rate:  [79-92] 88 (04/22 0553) Resp:  [11-18] 17 (04/22 0553) BP: (106-160)/(52-97) 130/92 (04/22 0553) SpO2:  [97 %-100 %] 97 % (04/22 0553) Weight:  [161 kg] 113 kg (04/21 1217) Last BM Date : 06/08/23 General:    white male in NAD Heart:  Regular rate and rhythm; no murmurs Lungs: Respirations even and unlabored, lungs CTA bilaterally Abdomen:  Soft, mild ttp in central abdomen and nondistended. Normal bowel sounds. Psych:  Cooperative. Normal mood and affect.  Intake/Output from previous day: 04/21 0701 - 04/22 0700 In: 1140 [P.O.:740; I.V.:200; IV Piggyback:200] Out: -  Intake/Output this shift: Total I/O In: 240 [P.O.:240] Out: -   Lab Results: Recent Labs    06/08/23 0424  WBC 6.8  HGB 15.7  HCT 45.1  PLT 187   BMET Recent Labs    06/08/23 0424  NA 132*  K 4.0  CL 97*  CO2 23  GLUCOSE 94  BUN <5*  CREATININE 0.87  CALCIUM 8.2*   LFT Recent Labs    06/08/23 0424  PROT 5.7*  ALBUMIN 3.1*  AST 29  ALT 28  ALKPHOS 51  BILITOT 1.3*     Assessment / Plan:   Assessment: 1.  Abdominal pain: See below, GI path panel and C. difficile negative, pain is now improved, patient reports improvement in pain from Robaxin  and less so from Dicyclomine , status post colonoscopy yesterday with biopsies for  potential IBD 2.  Abnormal imaging: CT 06/05/23 concerning for enterocolitis versus potential IBD (appearance unchanged from CT 4 days prior), increasing low fluid density ascites, overall thought to be infectious etiology versus underlying IBD 3.  OSA 4.  Hepatic steatosis 5.  History of colon polyps (previous TA's)  Plan: 1.  Colonoscopy yesterday with biopsy still pending 2.  Patient's pain is well-controlled with Robaxin  and Tramadol , also occasional as needed Zofran  for nausea 3.  Patient tolerating regular diet and is ready to go home. 4.  Will go ahead and get patient follow-up in our outpatient clinic.  Explained that when biopsies result we will let him know. 5.  Okay with discharge today-alerted the hospitalist team 6.  Would have patient complete a total of 10 days of antibiotics at time of discharge.  Thank you for your kind consultation, we will sign off   LOS: 4 days   Graciella Lavender  06/09/2023, 9:27 AM

## 2023-06-09 NOTE — Plan of Care (Signed)

## 2023-06-10 ENCOUNTER — Encounter: Payer: Self-pay | Admitting: Pediatrics

## 2023-06-10 LAB — CULTURE, BLOOD (ROUTINE X 2): Culture: NO GROWTH

## 2023-06-10 LAB — SURGICAL PATHOLOGY

## 2023-06-11 ENCOUNTER — Telehealth: Payer: Self-pay | Admitting: Pediatrics

## 2023-06-11 ENCOUNTER — Encounter: Payer: Self-pay | Admitting: Gastroenterology

## 2023-06-11 NOTE — Telephone Encounter (Signed)
 PT needs a note for work. He explained that it needed to be dated for Wednesday. Please advise.

## 2023-06-11 NOTE — Telephone Encounter (Signed)
 PT is calling to discuss results of procedure. Please advise

## 2023-06-12 NOTE — Telephone Encounter (Signed)
 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 Work note created and sent to pt via my chart.Pt made aware. Pt verbalized understanding with all questions answered.

## 2023-06-15 ENCOUNTER — Other Ambulatory Visit (HOSPITAL_COMMUNITY): Payer: Self-pay

## 2023-06-19 ENCOUNTER — Other Ambulatory Visit (HOSPITAL_COMMUNITY): Payer: Self-pay

## 2023-06-25 ENCOUNTER — Other Ambulatory Visit (HOSPITAL_COMMUNITY): Payer: Self-pay

## 2023-06-26 ENCOUNTER — Other Ambulatory Visit: Payer: Self-pay

## 2023-06-26 ENCOUNTER — Other Ambulatory Visit: Payer: Self-pay | Admitting: Gastroenterology

## 2023-06-26 ENCOUNTER — Telehealth: Payer: Self-pay | Admitting: Gastroenterology

## 2023-06-26 ENCOUNTER — Encounter: Payer: Self-pay | Admitting: Internal Medicine

## 2023-06-26 DIAGNOSIS — R002 Palpitations: Secondary | ICD-10-CM

## 2023-06-26 DIAGNOSIS — K648 Other hemorrhoids: Secondary | ICD-10-CM

## 2023-06-26 MED ORDER — HYDROCORTISONE ACETATE 25 MG RE SUPP
25.0000 mg | Freq: Every evening | RECTAL | 1 refills | Status: DC
Start: 2023-06-26 — End: 2023-07-23

## 2023-06-26 NOTE — Telephone Encounter (Signed)
 Dr. Karene Oto Pt Pt stated that 3 days ago he noticed a small amount of mucous and a significant amount of bright red blood in the toilet ( separate) from stool.  Pt stated that he has not seen any more mucous although pt stated that he is continues to have rectal bleeding with some BM but not every BM. Pt stated that he averages about 3 to 4 soft loose BM's  a day and half of those BM's have the a significant amount of bright red blood.  Pt was recently hospitalized at [facility] from 06/05/2023 to 06/09/2023. Colonoscopy performed on 06/08/2023.  Pt stated that he has never had any rectal bleeding before. Slight abdominal cramping and poor appetite. No there GI symptoms  Pt does have a previous scheduled OV on 07/08/2023 at 10:40  Pt was aware.  Pt notified if he has any SOB, chest pain, dizziness, lightheadedness then to go to the ED. Please review and advise.

## 2023-06-26 NOTE — Telephone Encounter (Signed)
 Pt made aware of Deanna May NP recommendations: Pt made aware of prescription and pharmacy.  Order for lab placed in epic. Pt made aware.  Pt verbalized understanding with all questions answered.

## 2023-06-26 NOTE — Progress Notes (Signed)
 Sent prescription for hydrocortisone suppositories for reported BRB noted with bowel movements I suspect is from hemorrhoids noted on recent felx sig he had. Fecal cal was normal.

## 2023-06-26 NOTE — Telephone Encounter (Signed)
 Inbound call from patient stating he has been passing blood. States he is concerned and requesting a call back. Please advise, thank you.

## 2023-06-30 ENCOUNTER — Ambulatory Visit (HOSPITAL_COMMUNITY): Attending: Cardiovascular Disease

## 2023-06-30 ENCOUNTER — Ambulatory Visit: Payer: Self-pay | Admitting: Internal Medicine

## 2023-06-30 DIAGNOSIS — R002 Palpitations: Secondary | ICD-10-CM | POA: Insufficient documentation

## 2023-06-30 LAB — ECHOCARDIOGRAM COMPLETE
Area-P 1/2: 3.63 cm2
S' Lateral: 3.4 cm

## 2023-07-08 ENCOUNTER — Ambulatory Visit (INDEPENDENT_AMBULATORY_CARE_PROVIDER_SITE_OTHER): Admitting: Gastroenterology

## 2023-07-08 ENCOUNTER — Encounter: Payer: Self-pay | Admitting: Gastroenterology

## 2023-07-08 VITALS — BP 134/86 | HR 109 | Ht 72.0 in | Wt 245.1 lb

## 2023-07-08 DIAGNOSIS — R748 Abnormal levels of other serum enzymes: Secondary | ICD-10-CM | POA: Diagnosis not present

## 2023-07-08 DIAGNOSIS — Z860101 Personal history of adenomatous and serrated colon polyps: Secondary | ICD-10-CM

## 2023-07-08 DIAGNOSIS — Z860100 Personal history of colon polyps, unspecified: Secondary | ICD-10-CM

## 2023-07-08 DIAGNOSIS — K760 Fatty (change of) liver, not elsewhere classified: Secondary | ICD-10-CM | POA: Diagnosis not present

## 2023-07-08 DIAGNOSIS — K2100 Gastro-esophageal reflux disease with esophagitis, without bleeding: Secondary | ICD-10-CM

## 2023-07-08 NOTE — Progress Notes (Signed)
 Chief Complaint:    Hospital follow-up  GI History: 48 year old male with a history of OSA (on CPAP), HTN, HLD, polycythemia follows in the GI clinic for the following:   1) History of colon polyps. Index colonoscopy in 12/2021 for routine CRC screening with multiple adenomatous polyps, to include a very large 25 mm polyp in the ascending colon that was removed incompletely via piecemeal fashion. There were 2 small islands of remnant adenomatous tissue which were somewhat firm and appeared to have a degree of submucosal tethering which did not allow for complete resection. The area proximal and distal to the polypectomy site was tattooed. Path with adenoma without high-grade dysplasia.  Ultimately, patient opted for repeat colonoscopy with EMR, completed in 04/2022 and no residual adenomatous tissue.  Plan for 3-year repeat   2) Elevated liver enzymes.  History of elevated AST/ALT since 03/2019.  Previously diagnosed with fatty liver by imaging, and he intentionally lost 45# w/ diet/exercise in 2023/24.  Does have a history of polycythemia and follows with Hematology at [facility] requiring prior phlebotomy.   - 03/22/2019: AST/ALT 99/136, normal ALP, T. Bili - 04/05/2019: RUQ US : Hepatic steatosis without duct dilatation. Normal GB - 11/26/2021: AST/ALT 111/84, T. bili 1.5, normal ALP - 02/27/2022: AST/ALT 128/66, T. bili 1.4, normal ALP - 04/2023: Extended serologic workup otherwise negative for concomitant liver disease.  Recommended hepatitis A/B vaccine series.  INR 1.1 - 05/08/2023: CT A/P: Severe hepatic steatosis, layering gallstones vs GB sludge without cholecystitis.  Normal pancreas, spleen, GI tract.  Recommended ultrasound elastography and Florentina Huntsman FibroSure     Endoscopic History: - 01/13/2022: Colonoscopy for initial CRC screening: 25 mm polyp in the ascending colon incompletely removed in piecemeal fashion with cold snare.  There were 2 areas that were incompletely resected and firm with  increased suspicion for submucosal tethering.  Lesion was closed with 2 hemostatic clips then the area 1-2 cm proximal and 2-3 cm distal were tattooed with spot.  Another 20 mm polyp in the ascending colon located immediately distal to the distal tattoo site which was semipedunculated and removed completely with hot snare and site closed with 1 hemostatic clip.  8 mm descending polyp removed with cold snare, 2 rectal polyps 2-5 mm removed with cold snare.  Small internal hemorrhoids.  Normal TI.  All polyps were adenomas without high-grade dysplasia.  - 05/08/2022: Colonoscopy (Dr. Brice Campi): Minor residual polypoid tissue within the central portion of postpolypectomy scar in the mid ascending colon removed with cold snare and forceps avulsion (path: Benign colonic mucosa without dysplasia).  2 small rectal hyperplastic polyps, grade 2 internal hemorrhoids.  Recommended repeat in 3 years - 05/20/2023: EGD for evaluation of early satiety and nausea/vomiting: LA Grade C erosive esophagitis, Hill grade 3 valve, moderate gastritis (H. pylori negative), normal duodenum.  Started on high-dose Protonix , sucralfate  - 06/08/2023: Colonoscopy (inpatient): Moderate retained stool with fair visualization.  Visualized colonic mucosa otherwise overall normal with diffuse mildly congested and scattered areas of erythema (path: Normal mucosa throughout).  Biopsy from the rectosigmoid with focal low-grade adenomatous dysplasia consistent with incidental diminutive tubular adenoma.  Short segment of scattered erythema and pinpoint red spots immediately proximal to the os of the IC valve with remainder of ileum normal (path: Normal).  Internal hemorrhoids    HPI:     Patient is a 48 y.o. male presenting to the Gastroenterology Clinic for hospital follow-up.   Was last seen in the office by me on 05/01/2023 for evaluation of nausea and  early satiety.  Expedited EGD 05/20/2023 with LA Grade C erosive esophagitis and moderate  non-H. pylori gastritis and was treated with high-dose PPI and sucralfate .  He was then admitted 4/18-22 for abdominal cramping, diarrhea, nausea/vomiting.  C. difficile and GI PCR panel negative.  Inpatient colonoscopy with some mild nonspecific scattered inflammatory changes in the distal TI and in the colon with biopsies being unremarkable.  There was an incidentally noted adenoma on the rectosigmoid biopsy.  He was diagnosed with nonspecific infectious enterocolitis and discharged with Augmentin , Bentyl , Robaxin , Zofran .  Today, he states he is "90% better". He did have BRBPR after hospital discharge, but has since stopped.   Interestingly, his liver enzymes completely normalized as inpatient.   No new labs or abdominal imaging since hospital discharge.    Review of systems:     No chest pain, no SOB, no fevers, no urinary sx   Past Medical History:  Diagnosis Date   Hyperlipidemia    Hypertension    Polycythemia    gives blood evey 2 weeks   Sleep apnea    c pap    Patient's surgical history, family medical history, social history, medications and allergies were all reviewed in Epic    Current Outpatient Medications  Medication Sig Dispense Refill   acetaminophen  (TYLENOL ) 325 MG tablet Take 2 tablets (650 mg total) by mouth every 6 (six) hours as needed for mild pain (pain score 1-3) (or Fever >/= 101). (Patient not taking: Reported on 07/08/2023)     amoxicillin -clavulanate (AUGMENTIN ) 875-125 MG tablet Take 1 tablet by mouth every 12 (twelve) hours. (Patient not taking: Reported on 07/08/2023) 12 tablet 0   dicyclomine  (BENTYL ) 10 MG capsule Take 1 capsule (10 mg total) by mouth 4 (four) times daily as needed for spasms. (Patient not taking: Reported on 07/08/2023) 20 capsule 0   hydrocortisone  (ANUSOL -HC) 25 MG suppository Place 1 suppository (25 mg total) rectally at bedtime. (Patient not taking: Reported on 07/08/2023) 10 suppository 1   methocarbamol  (ROBAXIN ) 500 MG tablet  Take 1 tablet (500 mg total) by mouth every 6 (six) hours as needed for muscle spasms. (Patient not taking: Reported on 07/08/2023) 20 tablet 0   ondansetron  (ZOFRAN -ODT) 4 MG disintegrating tablet Take 1 tablet (4 mg total) by mouth every 8 (eight) hours as needed for nausea or vomiting. (Patient not taking: Reported on 07/08/2023) 20 tablet 0   pantoprazole  (PROTONIX ) 40 MG tablet Take 1 tablet (40 mg total) by mouth 2 (two) times daily for 60 days, THEN 1 tablet (40 mg total) daily. Take Protonix  (pantoprazole ) 40 mg PO BID for 8 weeks, then reduce to 40 mg daily if upper GI. (Patient not taking: Reported on 07/08/2023) 180 tablet 0   tadalafil (CIALIS) 10 MG tablet Take 10 mg by mouth daily as needed for erectile dysfunction. (Patient not taking: Reported on 07/08/2023)     traMADol  (ULTRAM ) 50 MG tablet Take 1 tablet (50 mg total) by mouth every 6 (six) hours as needed for moderate pain (pain score 4-6) or severe pain (pain score 7-10). (Patient not taking: Reported on 07/08/2023) 20 tablet 0   No current facility-administered medications for this visit.    Physical Exam:     BP 134/86   Pulse (!) 109   Ht 6' (1.829 m)   Wt 245 lb 2 oz (111.2 kg)   BMI 33.24 kg/m   GENERAL:  Pleasant male in NAD PSYCH: : Cooperative, normal affect Musculoskeletal:  Normal muscle tone, normal strength NEURO:  Alert and oriented x 3, no focal neurologic deficits   IMPRESSION and PLAN:    1) GERD with erosive esophagitis - Resume Protonix  40 mg daily - Antireflux lifestyle/dietary modification - If recurrent/ongoing reflux symptoms, low threshold for repeat upper endoscopy to evaluate for ongoing erosive esophagitis  2) Infectious enterocolitis Recent hospitalization for presumed infectious enterocolitis, which is now nearly resolved.  Completed Augmentin  as prescribed.  No new medications at this time  3) Elevated liver enzymes Has had elevated liver enzymes since 03/2019 with imaging showing fatty  liver.  Intentionally lost 45# w/ diet/exercise in 2023/2024.  There was a change in AST/ALT ratio in late 2023 which was suspicious for possible conversion to steatohepatitis.  Extended serologic workup for concomitant liver disease otherwise negative/normal in 2025.  No radiographic or serologic evidence of portal hypertension or impaired hepatic synthetic function.  Interestingly, liver enzymes completely normalized during his recent hospital admission.  - Repeat liver enzymes in 6 months - Continue healthy diet and regular activity/exercise - May consider repeat imaging such as ultrasound elastography or referral for FibroScan depending on repeat labs - Close follow-up with PCP for management of underlying HLD, HTN  4) Colon polyp Recent colonoscopy with incidental diminutive adenoma noted on random biopsy from the rectosigmoid colon.  Likely represents a very small adenoma that was not readily seen in part due to the fair bowel preparation.  Discussed that finding in the office today.  Can consider repeat colonoscopy in 2-3 years for surveillance and due to fair prep.   RTC in 1 year or sooner prn       Annis Kinder ,DO, FACG 07/08/2023, 11:00 AM

## 2023-07-08 NOTE — Patient Instructions (Addendum)
 _______________________________________________________  If your blood pressure at your visit was 140/90 or greater, please contact your primary care physician to follow up on this.  If you are age 50 or younger, your body mass index should be between 19-25. Your Body mass index is 33.24 kg/m. If this is out of the aformentioned range listed, please consider follow up with your Primary Care Provider.  ________________________________________________________  The [facility] providers would like to encourage you to use MYCHART to communicate with providers for non-urgent requests or questions.  Due to long hold times on the telephone, sending your provider a message by [facility] may be a faster and more efficient way to get a response.  Please allow 48 business hours for a response.  Please remember that this is for non-urgent requests.  _______________________________________________________  It was a pleasure to see you today!  Vito Cirigliano, D.O.

## 2023-07-21 ENCOUNTER — Encounter (HOSPITAL_COMMUNITY): Payer: Self-pay

## 2023-07-21 ENCOUNTER — Emergency Department (HOSPITAL_COMMUNITY)

## 2023-07-21 ENCOUNTER — Other Ambulatory Visit: Payer: Self-pay

## 2023-07-21 ENCOUNTER — Emergency Department (HOSPITAL_COMMUNITY)
Admission: EM | Admit: 2023-07-21 | Discharge: 2023-07-21 | Disposition: A | Discharge status: Home / Self Care | Attending: Emergency Medicine | Admitting: Emergency Medicine

## 2023-07-21 DIAGNOSIS — I10 Essential (primary) hypertension: Secondary | ICD-10-CM | POA: Insufficient documentation

## 2023-07-21 DIAGNOSIS — Y908 Blood alcohol level of 240 mg/100 ml or more: Secondary | ICD-10-CM | POA: Diagnosis not present

## 2023-07-21 DIAGNOSIS — F10120 Alcohol abuse with intoxication, uncomplicated: Secondary | ICD-10-CM | POA: Insufficient documentation

## 2023-07-21 DIAGNOSIS — R4781 Slurred speech: Secondary | ICD-10-CM | POA: Insufficient documentation

## 2023-07-21 DIAGNOSIS — Z79899 Other long term (current) drug therapy: Secondary | ICD-10-CM | POA: Diagnosis not present

## 2023-07-21 DIAGNOSIS — F10129 Alcohol abuse with intoxication, unspecified: Secondary | ICD-10-CM

## 2023-07-21 DIAGNOSIS — F10920 Alcohol use, unspecified with intoxication, uncomplicated: Secondary | ICD-10-CM

## 2023-07-21 LAB — PROTIME-INR
INR: 1 (ref 0.8–1.2)
Prothrombin Time: 13.3 s (ref 11.4–15.2)

## 2023-07-21 LAB — DIFFERENTIAL
Abs Immature Granulocytes: 0.01 10*3/uL (ref 0.00–0.07)
Basophils Absolute: 0 10*3/uL (ref 0.0–0.1)
Basophils Relative: 1 %
Eosinophils Absolute: 0.1 10*3/uL (ref 0.0–0.5)
Eosinophils Relative: 2 %
Immature Granulocytes: 0 %
Lymphocytes Relative: 35 %
Lymphs Abs: 1.1 10*3/uL (ref 0.7–4.0)
Monocytes Absolute: 0.4 10*3/uL (ref 0.1–1.0)
Monocytes Relative: 12 %
Neutro Abs: 1.5 10*3/uL — ABNORMAL LOW (ref 1.7–7.7)
Neutrophils Relative %: 50 %

## 2023-07-21 LAB — CBC
HCT: 39.6 % (ref 39.0–52.0)
Hemoglobin: 14 g/dL (ref 13.0–17.0)
MCH: 34 pg (ref 26.0–34.0)
MCHC: 35.4 g/dL (ref 30.0–36.0)
MCV: 96.1 fL (ref 80.0–100.0)
Platelets: 175 10*3/uL (ref 150–400)
RBC: 4.12 MIL/uL — ABNORMAL LOW (ref 4.22–5.81)
RDW: 14.7 % (ref 11.5–15.5)
WBC: 3 10*3/uL — ABNORMAL LOW (ref 4.0–10.5)
nRBC: 0 % (ref 0.0–0.2)

## 2023-07-21 LAB — APTT: aPTT: 28 s (ref 24–36)

## 2023-07-21 LAB — COMPREHENSIVE METABOLIC PANEL WITH GFR
ALT: 66 U/L — ABNORMAL HIGH (ref 0–44)
AST: 139 U/L — ABNORMAL HIGH (ref 15–41)
Albumin: 3.8 g/dL (ref 3.5–5.0)
Alkaline Phosphatase: 59 U/L (ref 38–126)
Anion gap: 15 (ref 5–15)
BUN: <5 mg/dL — ABNORMAL LOW (ref 6–20)
CO2: 25 mmol/L (ref 22–32)
Calcium: 8.9 mg/dL (ref 8.9–10.3)
Chloride: 99 mmol/L (ref 98–111)
Creatinine, Ser: 0.74 mg/dL (ref 0.61–1.24)
GFR, Estimated: >60 mL/min (ref >60)
Glucose, Bld: 165 mg/dL — ABNORMAL HIGH (ref 70–99)
Potassium: 3 mmol/L — ABNORMAL LOW (ref 3.5–5.1)
Sodium: 139 mmol/L (ref 135–145)
Total Bilirubin: 0.9 mg/dL (ref 0.0–1.2)
Total Protein: 6.8 g/dL (ref 6.5–8.1)

## 2023-07-21 LAB — I-STAT CHEM 8, ED
BUN: <3 mg/dL — ABNORMAL LOW (ref 6–20)
Calcium, Ion: 1.02 mmol/L — ABNORMAL LOW (ref 1.15–1.40)
Chloride: 98 mmol/L (ref 98–111)
Creatinine, Ser: 1.2 mg/dL (ref 0.61–1.24)
Glucose, Bld: 167 mg/dL — ABNORMAL HIGH (ref 70–99)
HCT: 43 % (ref 39.0–52.0)
Hemoglobin: 14.6 g/dL (ref 13.0–17.0)
Potassium: 3 mmol/L — ABNORMAL LOW (ref 3.5–5.1)
Sodium: 139 mmol/L (ref 135–145)
TCO2: 24 mmol/L (ref 22–32)

## 2023-07-21 LAB — CBG MONITORING, ED: Glucose-Capillary: 168 mg/dL — ABNORMAL HIGH (ref 70–99)

## 2023-07-21 LAB — RAPID URINE DRUG SCREEN, HOSP PERFORMED
Amphetamines: NOT DETECTED
Barbiturates: NOT DETECTED
Benzodiazepines: NOT DETECTED
Cocaine: NOT DETECTED
Opiates: NOT DETECTED
Tetrahydrocannabinol: NOT DETECTED

## 2023-07-21 LAB — ETHANOL: Alcohol, Ethyl (B): 356 mg/dL (ref <15)

## 2023-07-21 MED ORDER — IOHEXOL 350 MG/ML SOLN
75.0000 mL | Freq: Once | INTRAVENOUS | Status: AC | PRN
  Administered 2023-07-21: 75 mL via INTRAVENOUS

## 2023-07-21 MED ORDER — SODIUM CHLORIDE 0.9% FLUSH
3.0000 mL | Freq: Once | INTRAVENOUS | Status: AC
  Administered 2023-07-21: 3 mL via INTRAVENOUS

## 2023-07-21 MED ORDER — POTASSIUM CHLORIDE CRYS ER 20 MEQ PO TBCR
40.0000 meq | EXTENDED_RELEASE_TABLET | Freq: Once | ORAL | Status: AC
  Administered 2023-07-21: 40 meq via ORAL
  Filled 2023-07-21: qty 2

## 2023-07-21 NOTE — ED Provider Notes (Signed)
  EMERGENCY DEPARTMENT AT [facility] Provider Note   CSN: [id number] Arrival date & time: 07/21/23  1630     History  No chief complaint on file.   Jordan Smith is a 47 y.o. male.  48 year old male with prior medical history as detailed below presents for evaluation.  Patient was last known well at 1515.  Symptoms discovered at 1545.  Patient reports that he remembers taking a nap.  When he woke up he was confused and had slurred speech.  Patient seen by neuroteam on arrival as a code stroke.  Patient does not meet criteria for thrombolytics at this time.  At the time of my evaluation, after CT, patient reports that he "feels like he is drunk".  He denies alcohol use.  The history is provided by the patient and the EMS personnel.       Home Medications Prior to Admission medications   Medication Sig Start Date End Date Taking? Authorizing Provider  acetaminophen  (TYLENOL ) 325 MG tablet Take 2 tablets (650 mg total) by mouth every 6 (six) hours as needed for mild pain (pain score 1-3) (or Fever >/= 101). Patient not taking: Reported on 07/08/2023 06/09/23   Vann, Jessica U, DO  amoxicillin -clavulanate (AUGMENTIN ) 875-125 MG tablet Take 1 tablet by mouth every 12 (twelve) hours. Patient not taking: Reported on 07/08/2023 06/09/23   Vann, Jessica U, DO  dicyclomine  (BENTYL ) 10 MG capsule Take 1 capsule (10 mg total) by mouth 4 (four) times daily as needed for spasms. Patient not taking: Reported on 07/08/2023 06/09/23   Vann, Jessica U, DO  hydrocortisone  (ANUSOL -HC) 25 MG suppository Place 1 suppository (25 mg total) rectally at bedtime. Patient not taking: Reported on 07/08/2023 06/26/23   May, Deanna J, NP  methocarbamol  (ROBAXIN ) 500 MG tablet Take 1 tablet (500 mg total) by mouth every 6 (six) hours as needed for muscle spasms. Patient not taking: Reported on 07/08/2023 06/09/23   Vann, Jessica U, DO  ondansetron  (ZOFRAN -ODT) 4 MG disintegrating tablet Take 1 tablet  (4 mg total) by mouth every 8 (eight) hours as needed for nausea or vomiting. Patient not taking: Reported on 07/08/2023 06/02/23   Royann Cords, PA  pantoprazole  (PROTONIX ) 40 MG tablet Take 1 tablet (40 mg total) by mouth 2 (two) times daily for 60 days, THEN 1 tablet (40 mg total) daily. Take Protonix  (pantoprazole ) 40 mg PO BID for 8 weeks, then reduce to 40 mg daily if upper GI. Patient not taking: Reported on 07/08/2023 05/20/23 09/17/23  Cirigliano, Vito V, DO  tadalafil (CIALIS) 10 MG tablet Take 10 mg by mouth daily as needed for erectile dysfunction. Patient not taking: Reported on 07/08/2023 12/05/21   [provider]  traMADol  (ULTRAM ) 50 MG tablet Take 1 tablet (50 mg total) by mouth every 6 (six) hours as needed for moderate pain (pain score 4-6) or severe pain (pain score 7-10). Patient not taking: Reported on 07/08/2023 06/09/23   Vann, Jessica U, DO      Allergies    Dilaudid  [hydromorphone ], Meperidine, and Dilaudid  [hydromorphone  hcl]    Review of Systems   Review of Systems  All other systems reviewed and are negative.   Physical Exam Updated Vital Signs Wt 111 kg   BMI 33.19 kg/m  Physical Exam Vitals and nursing note reviewed.  Constitutional:      General: He is not in acute distress.    Appearance: Normal appearance. He is well-developed.  HENT:     Head: Normocephalic  and atraumatic.  Eyes:     Conjunctiva/sclera: Conjunctivae normal.     Pupils: Pupils are equal, round, and reactive to light.  Cardiovascular:     Rate and Rhythm: Normal rate and regular rhythm.     Heart sounds: Normal heart sounds.  Pulmonary:     Effort: Pulmonary effort is normal. No respiratory distress.     Breath sounds: Normal breath sounds.  Abdominal:     General: There is no distension.     Palpations: Abdomen is soft.     Tenderness: There is no abdominal tenderness.  Musculoskeletal:        General: No deformity. Normal range of motion.     Cervical back: Normal  range of motion and neck supple.  Skin:    General: Skin is warm and dry.  Neurological:     General: No focal deficit present.     Mental Status: He is alert and oriented to person, place, and time.     ED Results / Procedures / Treatments   Labs (all labs ordered are listed, but only abnormal results are displayed) Labs Reviewed  I-STAT CHEM 8, ED - Abnormal; Notable for the following components:      Result Value   Potassium 3.0 (*)    BUN <3 (*)    Glucose, Bld 167 (*)    Calcium, Ion 1.02 (*)    All other components within normal limits  CBG MONITORING, ED - Abnormal; Notable for the following components:   Glucose-Capillary 168 (*)    All other components within normal limits  PROTIME-INR  APTT  CBC  DIFFERENTIAL  COMPREHENSIVE METABOLIC PANEL WITH GFR  ETHANOL    EKG None  Radiology CT HEAD CODE STROKE WO CONTRAST Result Date: 07/21/2023 CLINICAL DATA:  Code stroke. Provided history: Neuro deficit, acute, stroke suspected. Confusion. EXAM: CT HEAD WITHOUT CONTRAST TECHNIQUE: Contiguous axial images were obtained from the base of the skull through the vertex without intravenous contrast. RADIATION DOSE REDUCTION: This exam was performed according to the departmental dose-optimization program which includes automated exposure control, adjustment of the mA and/or kV according to patient size and/or use of iterative reconstruction technique. COMPARISON:  Head CT 02/27/2022. FINDINGS: Brain: No age-advanced or lobar predominant cerebral atrophy. There is no acute intracranial hemorrhage. No demarcated cortical infarct. No extra-axial fluid collection. No evidence of an intracranial mass. No midline shift. Vascular: No hyperdense vessel. Skull: No calvarial fracture or aggressive osseous lesion. Sinuses/Orbits: No mass or acute finding within the imaged orbits. No significant paranasal sinus disease at the imaged levels. ASPECTS [facility] Stroke Program Early CT Score) - Ganglionic  level infarction (caudate, lentiform nuclei, internal capsule, insula, M1-M3 cortex): 7 - Supraganglionic infarction (M4-M6 cortex): 3 Total score (0-10 with 10 being normal): 10 No evidence of an acute intracranial abnormality. These results were communicated to Dr. Doretta Gant at 4:48 pmon 6/3/2025by text page via the [facility] messaging system. IMPRESSION: No evidence of an acute intracranial abnormality. Electronically Signed   By: Bascom Lily D.O.   On: 07/21/2023 16:48    Procedures Procedures    Medications Ordered in ED Medications  sodium chloride  flush (NS) 0.9 % injection 3 mL (has no administration in time range)  iohexol  (OMNIPAQUE ) 350 MG/ML injection 75 mL (75 mLs Intravenous Contrast Given 07/21/23 1652)    ED Course/ Medical Decision Making/ A&P  Medical Decision Making Amount and/or Complexity of Data Reviewed Labs: ordered.  Risk Prescription drug management.    Medical Screen Complete  This patient presented to the ED with complaint of confusion, slurred speech.  This complaint involves an extensive number of treatment options. The initial differential diagnosis includes, but is not limited to, CVA, metabolic abnormality, EtOH intoxication, etc.  This presentation is: Acute, Chronic, Self-Limited, Previously Undiagnosed, Uncertain Prognosis, Complicated, Systemic Symptoms, and Threat to Life/Bodily Function  Patient presents with apparent acute alteration in mental status, slurred speech, confusion.  Patient denied alcohol consumption.  Neuro team saw the patient on arrival.  Patient did not meet criteria for lytics or other aggressive neurologic intervention.  EtOH level resulted at 356.  Patient does not want his testing results released to his wife who is at bedside.  Patient is advised that his alcohol intoxication is most likely cause of his symptoms today.  Patient is advised that if he is able to drink to a level of 356 and  maintain consciousness he probably has a significant alcohol addiction.  He is advised that he should seek treatment for same.  Patient does not appear to be interested in this at this time.  Again, patient did not want results of alcohol testing discussed in front of his family.  Neurology is aware of elevated alcohol level.  Patient does not require admission for CVA workup.  MRI is not required per Dr. Doretta Gant.  After observation, patient is improving.  He is appropriate for discharge. I attempted to have in-depth discussion with the patient about his need to seek treatment for alcoholism.  The patient is not interested in this discussion.  Patient is advised to follow-up closely in the outpatient setting with his outpatient care providers.  Importance of close follow-up is stressed.  Additional history obtained:  External records from outside sources obtained and reviewed including prior ED visits and prior Inpatient records.    Problem List / ED Course:  Slurred speech, alcohol intoxication   Reevaluation:  After the interventions noted above, I reevaluated the patient and found that they have: improved   Disposition:  After consideration of the diagnostic results and the patients response to treatment, I feel that the patent would benefit from close outpatient follow-up.   CRITICAL CARE Performed by: Burnette Carte   Total critical care time: 30 minutes  Critical care time was exclusive of separately billable procedures and treating other patients.  Critical care was necessary to treat or prevent imminent or life-threatening deterioration.  Critical care was time spent personally by me on the following activities: development of treatment plan with patient and/or surrogate as well as nursing, discussions with consultants, evaluation of patient's response to treatment, examination of patient, obtaining history from patient or surrogate, ordering and performing treatments  and interventions, ordering and review of laboratory studies, ordering and review of radiographic studies, pulse oximetry and re-evaluation of patient's condition.          Final Clinical Impression(s) / ED Diagnoses Final diagnoses:  Slurred speech  Alcoholic intoxication without complication [facility])    Rx / DC Orders ED Discharge Orders     None         Burnette Carte, MD 07/21/23 2029

## 2023-07-21 NOTE — ED Triage Notes (Signed)
 PT BIB [facility] from work after having a syncopal episode that coworkers walked in on. LKW 1515, S/S discovered at 1545. PT endorses seeing flashes of light and noticing slurred speech. Denies headache or dizziness. Aox4.   [facility] VS: BP: 140/100, HR 102, Spo2 96% on RA, CBG 126, RR 20

## 2023-07-21 NOTE — Code Documentation (Addendum)
 Stroke Response Nurse Documentation Code Documentation  Jordan Smith is a 48 y.o. male arriving to [facility]  via [facility] on 07/21/2023 with past medical hx of HLD, HTN, polycythemia, sleep apnea. On No antithrombotic. Code stroke was activated by EMS.   Patient from work where he was LKW at 1515 and now complaining of slurred speech and confusion. Per EMS, patient took a nap at work after lunch and was confused with slurred speech when he woke up.   Stroke team at the bedside on patient arrival. Labs drawn and patient cleared for CT by Dr. Reba Camper. Patient to CT with team. NIHSS 1, see documentation for details and code stroke times. Patient with dysarthria  on exam. The following imaging was completed:  CT Head. Patient is not a candidate for IV Thrombolytic due to low NIH per MD. Patient is not a candidate for IR per MD.   Care Plan: VS/NIHSS q19min until out of window at 1945, then q2hr x12hr, then q4hr.   Bedside handoff with ED RN Topher.    Code Stroke Canceled at 1845  Selestino Dakin  Stroke Response RN

## 2023-07-21 NOTE — Discharge Instructions (Signed)
 Return for any problem.   You have a problem with drinking too much alcohol.  You should seek help.  I have attached a resource guide to help you if you decide that you want help achieving sobriety.

## 2023-07-21 NOTE — Consult Note (Signed)
 NEUROLOGY CONSULT NOTE   Date of service: July 21, 2023 Patient Name: Jordan Smith MRN:  [id number] DOB:  15-Oct-1975 Chief Complaint: "CODE STROKE" Requesting Provider: Burnette Carte, MD  History of Present Illness  Richards Pherigo is a 48 y.o. male with hx of HTN, HLD, OSA, Polycythemia who was BIB EMS from work as a CODE STROKE due to confusion and slurred speech. Per EMS patient was found at 1545 in his office by coworkers confused. Patient said he was okay after lunch and took a nap, confirmed his last known well was 1515.  On exam at bridge, patient is awake and alert, oriented, VFF, EOMI, no sensory deficit, no focal weakness, no aphasia.Patient stated that his speech sounded slurred to him, mildly dysarthric on exam.  Patient was joking and then crying, stating he was scared.   LKW: 1515 Modified rankin score: 0-Completely asymptomatic and back to baseline post- stroke IV Thrombolysis: No, low NIH EVT: No, no LVO suspected  NIHSS components Score: Comment  1a Level of Conscious 0[]  1[]  2[]  3[]      1b LOC Questions 0[]  1[]  2[]       1c LOC Commands 0[]  1[]  2[]       2 Best Gaze 0[]  1[]  2[]       3 Visual 0[]  1[]  2[]  3[]      4 Facial Palsy 0[]  1[]  2[]  3[]      5a Motor Arm - left 0[]  1[]  2[]  3[]  4[]  UN[]    5b Motor Arm - Right 0[]  1[]  2[]  3[]  4[]  UN[]    6a Motor Leg - Left 0[]  1[]  2[]  3[]  4[]  UN[]    6b Motor Leg - Right 0[]  1[]  2[]  3[]  4[]  UN[]    7 Limb Ataxia 0[]  1[]  2[]  UN[]      8 Sensory 0[]  1[]  2[]  UN[]      9 Best Language 0[]  1[]  2[]  3[]      10 Dysarthria 0[]  1[x]  2[]  UN[]      11 Extinct. and Inattention 0[]  1[]  2[]       TOTAL:   1      ROS  Comprehensive ROS performed and pertinent positives documented in HPI   Past History   Past Medical History:  Diagnosis Date   Hyperlipidemia    Hypertension    Polycythemia    gives blood evey 2 weeks   Sleep apnea    c pap    Past Surgical History:  Procedure Laterality Date   BIOPSY  05/08/2022   Procedure: BIOPSY;   Surgeon: Normie Becton., MD;  Location: [facility];  Service: Gastroenterology;;   BIOPSY OF SKIN SUBCUTANEOUS TISSUE AND/OR MUCOUS MEMBRANE  06/08/2023   Procedure: BIOPSY, SKIN, SUBCUTANEOUS TISSUE, OR MUCOUS MEMBRANE;  Surgeon: Truddie Furrow, MD;  Location: [facility];  Service: Gastroenterology;;   Ritta Chessman RELEASE Right 2010   COLONOSCOPY N/A 06/08/2023   Procedure: COLONOSCOPY;  Surgeon: Truddie Furrow, MD;  Location: [facility];  Service: Gastroenterology;  Laterality: N/A;   COLONOSCOPY WITH PROPOFOL  N/A 05/08/2022   Procedure: COLONOSCOPY WITH PROPOFOL ;  Surgeon: Mansouraty, Albino Alu., MD;  Location: [facility];  Service: Gastroenterology;  Laterality: N/A;   ENDOSCOPIC MUCOSAL RESECTION N/A 05/08/2022   Procedure: ENDOSCOPIC MUCOSAL RESECTION;  Surgeon: Brice Campi Albino Alu., MD;  Location: [facility];  Service: Gastroenterology;  Laterality: N/A;   HERNIA REPAIR Bilateral    POLYPECTOMY  05/08/2022   Procedure: POLYPECTOMY;  Surgeon: Mansouraty, Albino Alu., MD;  Location: [facility];  Service: Gastroenterology;;    Family History: Family History  Problem  Relation Age of Onset   Aneurysm Mother    Hypothyroidism Mother    Vitamin D deficiency Sister    Cancer Maternal Aunt    Cancer Maternal Uncle    Glaucoma Maternal Grandfather    Colon cancer Neg Hx    Colon polyps Neg Hx    Crohn's disease Neg Hx    Esophageal cancer Neg Hx    Rectal cancer Neg Hx    Stomach cancer Neg Hx    Ulcerative colitis Neg Hx     Social History  reports that he quit smoking about 11 years ago. His smoking use included cigarettes. He started smoking about 31 years ago. He has a 20 pack-year smoking history. He has never been exposed to tobacco smoke. He has quit using smokeless tobacco. He reports current alcohol use of about 1.0 - 2.0 standard drink of alcohol per week. He reports that he does not use drugs.  Allergies  Allergen Reactions   Dilaudid   [Hydromorphone ] Other (See Comments)    hallucinations   Meperidine Anaphylaxis    Other Reaction(s): Other (See Comments) Happened as a child, pt does not remember reaction.     Dilaudid  [Hydromorphone  Hcl]     Hallucinations    Medications  No current facility-administered medications for this encounter.  Current Outpatient Medications:    acetaminophen  (TYLENOL ) 325 MG tablet, Take 2 tablets (650 mg total) by mouth every 6 (six) hours as needed for mild pain (pain score 1-3) (or Fever >/= 101). (Patient not taking: Reported on 07/08/2023), Disp: , Rfl:    amoxicillin -clavulanate (AUGMENTIN ) 875-125 MG tablet, Take 1 tablet by mouth every 12 (twelve) hours. (Patient not taking: Reported on 07/08/2023), Disp: 12 tablet, Rfl: 0   dicyclomine  (BENTYL ) 10 MG capsule, Take 1 capsule (10 mg total) by mouth 4 (four) times daily as needed for spasms. (Patient not taking: Reported on 07/08/2023), Disp: 20 capsule, Rfl: 0   hydrocortisone  (ANUSOL -HC) 25 MG suppository, Place 1 suppository (25 mg total) rectally at bedtime. (Patient not taking: Reported on 07/08/2023), Disp: 10 suppository, Rfl: 1   methocarbamol  (ROBAXIN ) 500 MG tablet, Take 1 tablet (500 mg total) by mouth every 6 (six) hours as needed for muscle spasms. (Patient not taking: Reported on 07/08/2023), Disp: 20 tablet, Rfl: 0   ondansetron  (ZOFRAN -ODT) 4 MG disintegrating tablet, Take 1 tablet (4 mg total) by mouth every 8 (eight) hours as needed for nausea or vomiting. (Patient not taking: Reported on 07/08/2023), Disp: 20 tablet, Rfl: 0   pantoprazole  (PROTONIX ) 40 MG tablet, Take 1 tablet (40 mg total) by mouth 2 (two) times daily for 60 days, THEN 1 tablet (40 mg total) daily. Take Protonix  (pantoprazole ) 40 mg PO BID for 8 weeks, then reduce to 40 mg daily if upper GI. (Patient not taking: Reported on 07/08/2023), Disp: 180 tablet, Rfl: 0   tadalafil (CIALIS) 10 MG tablet, Take 10 mg by mouth daily as needed for erectile dysfunction.  (Patient not taking: Reported on 07/08/2023), Disp: , Rfl:    traMADol  (ULTRAM ) 50 MG tablet, Take 1 tablet (50 mg total) by mouth every 6 (six) hours as needed for moderate pain (pain score 4-6) or severe pain (pain score 7-10). (Patient not taking: Reported on 07/08/2023), Disp: 20 tablet, Rfl: 0  Vitals   Vitals:   07/21/23 1600  Weight: 111 kg    Body mass index is 33.19 kg/m.  Physical Exam   Constitutional: Appears well-developed and well-nourished.  Cardiovascular: Normal rate and regular rhythm.  Respiratory: Effort normal, non-labored breathing.   Neurologic Examination   Neuro: Mental Status: Patient is awake, alert, oriented to person, place, month, year, and situation. Patient is able to give a clear and coherent history. No signs aphasia or neglect. Mild dysarthria.  Cranial Nerves: II: Visual Fields are full. Pupils are equal, round, and reactive to light.   III,IV, VI: EOMI without ptosis or diploplia.  V: Facial sensation is symmetric to temperature VII: Facial movement is symmetric.  VIII: hearing is intact to voice X: Uvula elevates symmetrically XI: Shoulder shrug is symmetric. XII: tongue is midline without atrophy or fasciculations.  Motor: Tone is normal. Bulk is normal. 5/5 strength was present in all four extremities.  Sensory: Sensation is symmetric to light touch and temperature in the arms and legs. Cerebellar: FNF and HKS are intact bilaterally   Labs/Imaging/Neurodiagnostic studies   CBC: No results for input(s): "WBC", "NEUTROABS", "HGB", "HCT", "MCV", "PLT" in the last 168 hours. Basic Metabolic Panel:  Lab Results  Component Value Date   NA 132 (L) 06/08/2023   K 4.0 06/08/2023   CO2 23 06/08/2023   GLUCOSE 94 06/08/2023   BUN <5 (L) 06/08/2023   CREATININE 0.87 06/08/2023   CALCIUM 8.2 (L) 06/08/2023   GFRNONAA >60 06/08/2023   Lipid Panel:  Lab Results  Component Value Date   LDLCALC 167 (H) 03/22/2019   HgbA1c: No results  found for: "HGBA1C" Urine Drug Screen: No results found for: "LABOPIA", "COCAINSCRNUR", "LABBENZ", "AMPHETMU", "THCU", "LABBARB"  Alcohol Level No results found for: "ETH" INR  Lab Results  Component Value Date   INR 1.1 06/06/2023   Ethanol: 356  CT Head without contrast(Personally reviewed): No evidence of an acute intracranial abnormality.   CT angio Head and Neck with contrast(Personally reviewed): Pending   MRI Brain(Personally reviewed): pending   ASSESSMENT   Mackinley Kiehn is a 48 y.o. male with hx of HTN, HLD, OSA, Polycythemia who was BIB EMS from work as a CODE STROKE due to confusion and slurred speech. Per EMS patient was found at 1545 in his office by coworkers confused. Patient said he was okay after lunch and took a nap, confirmed his last known well was 1515.   On exam at bridge, patient is awake and alert, oriented, VFF, EOMI, no sensory deficit, no focal weakness, no aphasia.Patient stated that his speech sounded slurred to him, mildly dysarthric on exam.    Patient's alcohol level is elevated at 356. It is likely that his slurred speech is due to intoxication, but we can evaluate further for a stroke given his dysarthria symptoms and patient's report of recent double vision.   Impression: Acute alcohol intoxication  RECOMMENDATIONS   - cancel CODE STROKE - reactivate CODE STROKE if there are acute neuro changes ______________________________________________________________________    Signed, Audrene Lease, NP [facility]    Attending Neurohospitalist Addendum Patient seen and examined with APP/Resident. Agree with the history and physical as documented above. Agree with the plan as documented, which I helped formulate. I have edited the note above to reflect my full findings and recommendations. I have independently reviewed the chart, obtained history, review of systems and examined the patient.I have personally reviewed pertinent  head/neck/spine imaging (CT/MRI). Please feel free to call with any questions.  -- Greg Leaks, MD [facility] [phone]  If 7pm- 7am, please page neurology on call as listed in AMION.

## 2023-07-23 ENCOUNTER — Ambulatory Visit (HOSPITAL_COMMUNITY): Admission: EM | Admit: 2023-07-23 | Discharge: 2023-07-24 | Attending: Psychiatry | Admitting: Psychiatry

## 2023-07-23 DIAGNOSIS — F1090 Alcohol use, unspecified, uncomplicated: Secondary | ICD-10-CM | POA: Diagnosis not present

## 2023-07-23 DIAGNOSIS — F1010 Alcohol abuse, uncomplicated: Secondary | ICD-10-CM | POA: Diagnosis present

## 2023-07-23 LAB — POCT URINE DRUG SCREEN - MANUAL ENTRY (I-SCREEN)
POC Amphetamine UR: NOT DETECTED
POC Buprenorphine (BUP): NOT DETECTED
POC Cocaine UR: NOT DETECTED
POC Marijuana UR: NOT DETECTED
POC Methadone UR: NOT DETECTED
POC Methamphetamine UR: NOT DETECTED
POC Morphine: NOT DETECTED
POC Oxazepam (BZO): NOT DETECTED
POC Oxycodone UR: NOT DETECTED
POC Secobarbital (BAR): NOT DETECTED

## 2023-07-23 LAB — COMPREHENSIVE METABOLIC PANEL WITH GFR
ALT: 43 U/L (ref 0–44)
AST: 57 U/L — ABNORMAL HIGH (ref 15–41)
Albumin: 3.9 g/dL (ref 3.5–5.0)
Alkaline Phosphatase: 67 U/L (ref 38–126)
Anion gap: 14 (ref 5–15)
BUN: 6 mg/dL (ref 6–20)
CO2: 26 mmol/L (ref 22–32)
Calcium: 9.6 mg/dL (ref 8.9–10.3)
Chloride: 95 mmol/L — ABNORMAL LOW (ref 98–111)
Creatinine, Ser: 0.83 mg/dL (ref 0.61–1.24)
GFR, Estimated: >60 mL/min (ref >60)
Glucose, Bld: 116 mg/dL — ABNORMAL HIGH (ref 70–99)
Potassium: 3.3 mmol/L — ABNORMAL LOW (ref 3.5–5.1)
Sodium: 135 mmol/L (ref 135–145)
Total Bilirubin: 2.2 mg/dL — ABNORMAL HIGH (ref 0.0–1.2)
Total Protein: 6.7 g/dL (ref 6.5–8.1)

## 2023-07-23 LAB — CBC WITH DIFFERENTIAL/PLATELET
Abs Immature Granulocytes: 0.01 10*3/uL (ref 0.00–0.07)
Basophils Absolute: 0 10*3/uL (ref 0.0–0.1)
Basophils Relative: 1 %
Eosinophils Absolute: 0.1 10*3/uL (ref 0.0–0.5)
Eosinophils Relative: 2 %
HCT: 41.8 % (ref 39.0–52.0)
Hemoglobin: 14.9 g/dL (ref 13.0–17.0)
Immature Granulocytes: 0 %
Lymphocytes Relative: 25 %
Lymphs Abs: 0.8 10*3/uL (ref 0.7–4.0)
MCH: 34.2 pg — ABNORMAL HIGH (ref 26.0–34.0)
MCHC: 35.6 g/dL (ref 30.0–36.0)
MCV: 95.9 fL (ref 80.0–100.0)
Monocytes Absolute: 0.3 10*3/uL (ref 0.1–1.0)
Monocytes Relative: 9 %
Neutro Abs: 2.1 10*3/uL (ref 1.7–7.7)
Neutrophils Relative %: 63 %
Platelets: 160 10*3/uL (ref 150–400)
RBC: 4.36 MIL/uL (ref 4.22–5.81)
RDW: 14.6 % (ref 11.5–15.5)
WBC: 3.4 10*3/uL — ABNORMAL LOW (ref 4.0–10.5)
nRBC: 0 % (ref 0.0–0.2)

## 2023-07-23 LAB — TSH: TSH: 8.361 u[IU]/mL — ABNORMAL HIGH (ref 0.350–4.500)

## 2023-07-23 LAB — ETHANOL: Alcohol, Ethyl (B): <15 mg/dL (ref <15)

## 2023-07-23 MED ORDER — ONDANSETRON 4 MG PO TBDP: 4.0000 mg | ORAL_TABLET | Freq: Four times a day (QID) | ORAL | Status: DC | PRN

## 2023-07-23 MED ORDER — HALOPERIDOL 5 MG PO TABS
5.0000 mg | ORAL_TABLET | Freq: Three times a day (TID) | ORAL | Status: DC | PRN
Start: 2023-07-23 — End: 2023-07-24

## 2023-07-23 MED ORDER — ALUM & MAG HYDROXIDE-SIMETH 200-200-20 MG/5ML PO SUSP: 30.0000 mL | ORAL | Status: DC | PRN

## 2023-07-23 MED ORDER — NICOTINE 21 MG/24HR TD PT24
21.0000 mg | MEDICATED_PATCH | Freq: Once | TRANSDERMAL | Status: DC
  Administered 2023-07-23: 21 mg via TRANSDERMAL
  Filled 2023-07-23: qty 1

## 2023-07-23 MED ORDER — LORAZEPAM 1 MG PO TABS: 1.0000 mg | ORAL_TABLET | Freq: Two times a day (BID) | ORAL | Status: DC

## 2023-07-23 MED ORDER — DIPHENHYDRAMINE HCL 50 MG/ML IJ SOLN
50.0000 mg | Freq: Three times a day (TID) | INTRAMUSCULAR | Status: DC | PRN
Start: 2023-07-23 — End: 2023-07-24

## 2023-07-23 MED ORDER — POTASSIUM CHLORIDE CRYS ER 20 MEQ PO TBCR
40.0000 meq | EXTENDED_RELEASE_TABLET | Freq: Once | ORAL | Status: AC
  Administered 2023-07-23: 40 meq via ORAL
  Filled 2023-07-23: qty 2

## 2023-07-23 MED ORDER — THIAMINE MONONITRATE 100 MG PO TABS
100.0000 mg | ORAL_TABLET | Freq: Every day | ORAL | Status: DC
  Administered 2023-07-24: 100 mg via ORAL
  Filled 2023-07-23: qty 1

## 2023-07-23 MED ORDER — THIAMINE HCL 100 MG/ML IJ SOLN
100.0000 mg | Freq: Once | INTRAMUSCULAR | Status: AC
  Administered 2023-07-23: 100 mg via INTRAMUSCULAR
  Filled 2023-07-23: qty 2

## 2023-07-23 MED ORDER — DIPHENHYDRAMINE HCL 50 MG/ML IJ SOLN: 50.0000 mg | Freq: Three times a day (TID) | INTRAMUSCULAR | Status: DC | PRN

## 2023-07-23 MED ORDER — LORAZEPAM 1 MG PO TABS
1.0000 mg | ORAL_TABLET | Freq: Four times a day (QID) | ORAL | Status: AC
  Administered 2023-07-23 – 2023-07-24 (×4): 1 mg via ORAL
  Filled 2023-07-23 (×4): qty 1

## 2023-07-23 MED ORDER — HYDROXYZINE HCL 25 MG PO TABS
25.0000 mg | ORAL_TABLET | Freq: Three times a day (TID) | ORAL | Status: DC | PRN
Start: 2023-07-23 — End: 2023-07-24
  Administered 2023-07-23 – 2023-07-24 (×3): 25 mg via ORAL
  Filled 2023-07-23 (×3): qty 1

## 2023-07-23 MED ORDER — LORAZEPAM 2 MG/ML IJ SOLN: 2.0000 mg | Freq: Three times a day (TID) | INTRAMUSCULAR | Status: DC | PRN

## 2023-07-23 MED ORDER — LORAZEPAM 1 MG PO TABS: 1.0000 mg | ORAL_TABLET | Freq: Three times a day (TID) | ORAL | Status: DC

## 2023-07-23 MED ORDER — LORAZEPAM 1 MG PO TABS: 1.0000 mg | ORAL_TABLET | Freq: Every day | ORAL | Status: DC

## 2023-07-23 MED ORDER — TRAZODONE HCL 50 MG PO TABS
50.0000 mg | ORAL_TABLET | Freq: Every evening | ORAL | Status: DC | PRN
  Administered 2023-07-23: 50 mg via ORAL
  Filled 2023-07-23: qty 1

## 2023-07-23 MED ORDER — ADULT MULTIVITAMIN W/MINERALS CH
1.0000 | ORAL_TABLET | Freq: Every day | ORAL | Status: DC
Start: 2023-07-23 — End: 2023-07-24
  Administered 2023-07-23 – 2023-07-24 (×2): 1 via ORAL
  Filled 2023-07-23 (×2): qty 1

## 2023-07-23 MED ORDER — DIPHENHYDRAMINE HCL 50 MG PO CAPS: 50.0000 mg | ORAL_CAPSULE | Freq: Three times a day (TID) | ORAL | Status: DC | PRN

## 2023-07-23 MED ORDER — MAGNESIUM HYDROXIDE 400 MG/5ML PO SUSP: 30.0000 mL | Freq: Every day | ORAL | Status: DC | PRN

## 2023-07-23 MED ORDER — CLONIDINE HCL 0.1 MG PO TABS
0.1000 mg | ORAL_TABLET | Freq: Once | ORAL | Status: AC
  Administered 2023-07-23: 0.1 mg via ORAL
  Filled 2023-07-23: qty 1

## 2023-07-23 MED ORDER — ACETAMINOPHEN 325 MG PO TABS: 650.0000 mg | ORAL_TABLET | Freq: Four times a day (QID) | ORAL | Status: DC | PRN

## 2023-07-23 MED ORDER — HALOPERIDOL LACTATE 5 MG/ML IJ SOLN: 10.0000 mg | Freq: Three times a day (TID) | INTRAMUSCULAR | Status: DC | PRN

## 2023-07-23 MED ORDER — HALOPERIDOL LACTATE 5 MG/ML IJ SOLN: 5.0000 mg | Freq: Three times a day (TID) | INTRAMUSCULAR | Status: DC | PRN

## 2023-07-23 MED ORDER — NICOTINE 21 MG/24HR TD PT24
21.0000 mg | MEDICATED_PATCH | Freq: Every day | TRANSDERMAL | Status: DC
  Administered 2023-07-24: 21 mg via TRANSDERMAL
  Filled 2023-07-23: qty 1

## 2023-07-23 NOTE — ED Provider Notes (Signed)
 [facility] Continuous Assessment Admission H&P  Date: 07/23/23 Patient Name: Jordan Smith MRN: [id number] Chief Complaint: alcohol detox  Diagnoses:  Final diagnoses:  Alcohol use disorder    HPI: Jordan Smith is a 48 year old male with a past psychiatric history who AUD who presents to [facility] accompanied by his wife for alcohol detox. He reports drinking heavily for the past 2 year. He drinks around 4 times a week and drink 0.5 L of liquor at a time. He denies history of withdrawal seizures, AVH, and DTs. He is currently experiencing tremors. He reports on Tuesday, he blacked out and went to the ED where he felt SOB. He states that he did not receive medication for alcohol withdrawal at the time. He also reports nicotine use for years. He initially smoked cigarettes and stopped a few years ago and nowadays he uses a vape. He denies going to rehab in the past. At this time, he is requesting detox and outpatient resources. 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 He agrees to go to the observation unit and start detox treatment and once a bed is available that he will go to [facility].   Total Time spent with patient: 45 minutes  Musculoskeletal  Strength & Muscle Tone: within normal limits Gait & Station: normal Patient leans: N/A  Psychiatric Specialty Exam  Presentation General Appearance: Appropriate for Environment; Fairly Groomed  Eye Contact:Fair  Speech:Clear and Coherent; Normal Rate  Speech Volume:Normal  Handedness:Right   Mood and Affect  Mood:Depressed  Affect:Congruent   Thought Process  Thought Processes:Coherent; Linear; Goal Directed  Descriptions of Associations:Intact  Orientation:Full (Time, Place and Person)  Thought Content:Logical; WDL    Hallucinations:Hallucinations: None  Ideas of Reference:None  Suicidal  Thoughts:Suicidal Thoughts: No  Homicidal Thoughts:Homicidal Thoughts: No   Sensorium  Memory:Remote Good  Judgment:Fair  Insight:Fair   Executive Functions  Concentration:Good  Attention Span:Good  Recall:Good  Fund of Knowledge:Good  Language:Good   Psychomotor Activity  Psychomotor Activity:Psychomotor Activity: Tremor   Assets  Assets:Communication Skills; Resilience   Sleep  Sleep:Sleep: Fair   Nutritional Assessment (For OBS and FBC admissions only) Has the patient had a weight loss or gain of 10 pounds or more in the last 3 months?: No Has the patient had a decrease in food intake/or appetite?: No Does the patient have dental problems?: No Does the patient have eating habits or behaviors that may be indicators of an eating disorder including binging or inducing vomiting?: No Has the patient recently lost weight without trying?: 0 Has the patient been eating poorly because of a decreased appetite?: 0 Malnutrition Screening Tool Score: 0    Physical Exam Vitals reviewed.  Constitutional:      Appearance: Normal appearance.  HENT:     Head: Normocephalic and atraumatic.  Cardiovascular:     Rate and Rhythm: Normal rate.  Pulmonary:     Effort: Pulmonary effort is normal.  Neurological:     General: No focal deficit present.     Mental Status: He is alert and oriented to person, place, and time.    Review of Systems  Constitutional:  Negative for fever.  Respiratory:  Negative for cough.   Cardiovascular:  Negative for chest pain.  Gastrointestinal:  Negative for nausea.  Neurological:  Positive for tremors.  Psychiatric/Behavioral:  Negative for suicidal ideas.     Blood pressure Aaron Aas)  150/101, pulse 94, temperature 98.2 F (36.8 C), temperature source Oral, resp. rate 16, SpO2 100%. There is no height or weight on file to calculate BMI.  Past Psychiatric History:  Dx: AUD Denies current psychotropic medications Therapy: better  health No prior psychiatric hospitalizations  Is the patient at risk to self? No  Has the patient been a risk to self in the past 6 months? No .    Has the patient been a risk to self within the distant past? No   Is the patient a risk to others? No   Has the patient been a risk to others in the past 6 months? No   Has the patient been a risk to others within the distant past? No   Past Medical History: HLD, HTN  Family History: denies psychiatric history  Social History: lives with wife and daughter (91 years old), currently a Production designer, theatre/television/film  Last Labs:  Admission on 07/21/2023, Discharged on 07/21/2023  Component Date Value Ref Range Status   Prothrombin Time 07/21/2023 13.3  11.4 - 15.2 seconds Final   INR 07/21/2023 1.0  0.8 - 1.2 Final   Comment: (NOTE) INR goal varies based on device and disease states. Performed at [facility], [address] [place], [place] [address]    aPTT 07/21/2023 28  24 - 36 seconds Final   Performed at [facility], [address], [place], [place] [address]   WBC 07/21/2023 3.0 (L)  4.0 - 10.5 K/uL Final   RBC 07/21/2023 4.12 (L)  4.22 - 5.81 MIL/uL Final   Hemoglobin 07/21/2023 14.0  13.0 - 17.0 g/dL Final   HCT 09/81/1914 39.6  39.0 - 52.0 % Final   MCV 07/21/2023 96.1  80.0 - 100.0 fL Final   MCH 07/21/2023 34.0  26.0 - 34.0 pg Final   MCHC 07/21/2023 35.4  30.0 - 36.0 g/dL Final   RDW 78/29/5621 14.7  11.5 - 15.5 % Final   Platelets 07/21/2023 175  150 - 400 K/uL Final   nRBC 07/21/2023 0.0  0.0 - 0.2 % Final   Performed at [facility], [address], [place], [place] [address]   Neutrophils Relative % 07/21/2023 50  % Final   Neutro Abs 07/21/2023 1.5 (L)  1.7 - 7.7 K/uL Final   Lymphocytes Relative 07/21/2023 35  % Final   Lymphs Abs 07/21/2023 1.1  0.7 - 4.0 K/uL Final   Monocytes Relative 07/21/2023 12  % Final   Monocytes Absolute 07/21/2023 0.4  0.1 - 1.0 K/uL Final   Eosinophils Relative 07/21/2023 2  % Final    Eosinophils Absolute 07/21/2023 0.1  0.0 - 0.5 K/uL Final   Basophils Relative 07/21/2023 1  % Final   Basophils Absolute 07/21/2023 0.0  0.0 - 0.1 K/uL Final   Immature Granulocytes 07/21/2023 0  % Final   Abs Immature Granulocytes 07/21/2023 0.01  0.00 - 0.07 K/uL Final   Performed at [facility], [address], [place], [place] [address]   Sodium 07/21/2023 139  135 - 145 mmol/L Final   Potassium 07/21/2023 3.0 (L)  3.5 - 5.1 mmol/L Final   Chloride 07/21/2023 99  98 - 111 mmol/L Final   CO2 07/21/2023 25  22 - 32 mmol/L Final   Glucose, Bld 07/21/2023 165 (H)  70 - 99 mg/dL Final   Glucose reference range applies only to samples taken after fasting for at least 8 hours.   BUN 07/21/2023 <5 (L)  6 - 20 mg/dL Final   Creatinine, Ser 07/21/2023 0.74  0.61 - 1.24 mg/dL Final   Calcium 40/98/1191 8.9  8.9 - 10.3 mg/dL Final   Total Protein 47/82/9562 6.8  6.5 - 8.1 g/dL Final   Albumin 13/09/6576 3.8  3.5 - 5.0 g/dL Final   AST 46/96/2952 139 (H)  15 - 41 U/L Final   ALT 07/21/2023 66 (H)  0 - 44 U/L Final   Alkaline Phosphatase 07/21/2023 59  38 - 126 U/L Final   Total Bilirubin 07/21/2023 0.9  0.0 - 1.2 mg/dL Final   GFR, Estimated 07/21/2023 >60  >60 mL/min Final   Comment: (NOTE) Calculated using the CKD-EPI Creatinine Equation (2021)    Anion gap 07/21/2023 15  5 - 15 Final   Performed at [facility], [address] [place], [place] [address]   Alcohol, Ethyl (B) 07/21/2023 356 (HH)  <15 mg/dL Final   Comment: CRITICAL RESULT CALLED TO, READ BACK BY AND VERIFIED WITH C. APPLEGATE, RN AT 1750 06.03.25 D. BLU (NOTE) For medical purposes only. Performed at [facility], [address], [place], [place] [address]    Sodium 07/21/2023 139  135 - 145 mmol/L Final   Potassium 07/21/2023 3.0 (L)  3.5 - 5.1 mmol/L Final   Chloride 07/21/2023 98  98 - 111 mmol/L Final   BUN 07/21/2023 <3 (L)  6 - 20 mg/dL Final   Creatinine, Ser 07/21/2023 1.20  0.61 - 1.24 mg/dL  Final   Glucose, Bld 27/25/3664 167 (H)  70 - 99 mg/dL Final   Glucose reference range applies only to samples taken after fasting for at least 8 hours.   Calcium, Ion 07/21/2023 1.02 (L)  1.15 - 1.40 mmol/L Final   TCO2 07/21/2023 24  22 - 32 mmol/L Final   Hemoglobin 07/21/2023 14.6  13.0 - 17.0 g/dL Final   HCT 40/34/7425 43.0  39.0 - 52.0 % Final   Glucose-Capillary 07/21/2023 168 (H)  70 - 99 mg/dL Final   Glucose reference range applies only to samples taken after fasting for at least 8 hours.   Opiates 07/21/2023 NONE DETECTED  NONE DETECTED Final   Cocaine 07/21/2023 NONE DETECTED  NONE DETECTED Final   Benzodiazepines 07/21/2023 NONE DETECTED  NONE DETECTED Final   Amphetamines 07/21/2023 NONE DETECTED  NONE DETECTED Final   Tetrahydrocannabinol 07/21/2023 NONE DETECTED  NONE DETECTED Final   Barbiturates 07/21/2023 NONE DETECTED  NONE DETECTED Final   Comment: (NOTE) DRUG SCREEN FOR MEDICAL PURPOSES ONLY.  IF CONFIRMATION IS NEEDED FOR ANY PURPOSE, NOTIFY LAB WITHIN 5 DAYS.  LOWEST DETECTABLE LIMITS FOR URINE DRUG SCREEN Drug Class                     Cutoff (ng/mL) Amphetamine and metabolites    1000 Barbiturate and metabolites    200 Benzodiazepine                 200 Opiates and metabolites        300 Cocaine and metabolites        300 THC                            50 Performed at [facility], [address], [place], [place] [address]   Ancillary Procedure on 06/30/2023  Component Date Value Ref Range Status   Area-P 1/2 06/30/2023 3.63  cm2 Final   S' Lateral  06/30/2023 3.40  cm Final   Est EF 06/30/2023 55 - 60%   Final  Admission on 06/05/2023, Discharged on 06/09/2023  Component Date Value Ref Range Status   Lipase 06/05/2023 32  11 - 51 U/L Final   Performed at [facility], [address], [place], [place] [address]   Sodium 06/05/2023 131 (L)  135 - 145 mmol/L Final   Potassium 06/05/2023 3.4 (L)  3.5 - 5.1 mmol/L Final    Chloride 06/05/2023 92 (L)  98 - 111 mmol/L Final   CO2 06/05/2023 27  22 - 32 mmol/L Final   Glucose, Bld 06/05/2023 95  70 - 99 mg/dL Final   Glucose reference range applies only to samples taken after fasting for at least 8 hours.   BUN 06/05/2023 11  6 - 20 mg/dL Final   Creatinine, Ser 06/05/2023 0.93  0.61 - 1.24 mg/dL Final   Calcium 19/14/7829 9.0  8.9 - 10.3 mg/dL Final   Total Protein 56/21/3086 6.3 (L)  6.5 - 8.1 g/dL Final   Albumin 57/84/6962 4.0  3.5 - 5.0 g/dL Final   AST 95/28/4132 57 (H)  15 - 41 U/L Final   ALT 06/05/2023 43  0 - 44 U/L Final   Alkaline Phosphatase 06/05/2023 55  38 - 126 U/L Final   Total Bilirubin 06/05/2023 1.6 (H)  0.0 - 1.2 mg/dL Final   GFR, Estimated 06/05/2023 >60  >60 mL/min Final   Comment: (NOTE) Calculated using the CKD-EPI Creatinine Equation (2021)    Anion gap 06/05/2023 12  5 - 15 Final   Performed at [facility], [address] [place], [place] [address]   WBC 06/05/2023 6.4  4.0 - 10.5 K/uL Final   RBC 06/05/2023 4.75  4.22 - 5.81 MIL/uL Final   Hemoglobin 06/05/2023 15.8  13.0 - 17.0 g/dL Final   HCT 27/25/3664 44.6  39.0 - 52.0 % Final   MCV 06/05/2023 93.9  80.0 - 100.0 fL Final   MCH 06/05/2023 33.3  26.0 - 34.0 pg Final   MCHC 06/05/2023 35.4  30.0 - 36.0 g/dL Final   RDW 40/34/7425 13.7  11.5 - 15.5 % Final   Platelets 06/05/2023 192  150 - 400 K/uL Final   nRBC 06/05/2023 0.0  0.0 - 0.2 % Final   Performed at [facility], [address], [place], [place] [address]   Color, Urine 06/05/2023 YELLOW  YELLOW Final   APPearance 06/05/2023 CLEAR  CLEAR Final   Specific Gravity, Urine 06/05/2023 >1.046 (H)  1.005 - 1.030 Final   pH 06/05/2023 7.0  5.0 - 8.0 Final   Glucose, UA 06/05/2023 NEGATIVE  NEGATIVE mg/dL Final   Hgb urine dipstick 06/05/2023 NEGATIVE  NEGATIVE Final   Bilirubin Urine 06/05/2023 SMALL (A)  NEGATIVE Final   Ketones, ur 06/05/2023 >80 (A)  NEGATIVE mg/dL Final    Protein, ur 75/64/3329 30 (A)  NEGATIVE mg/dL Final   Nitrite 51/88/4166 NEGATIVE  NEGATIVE Final   Leukocytes,Ua 06/05/2023 NEGATIVE  NEGATIVE Final   RBC / HPF 06/05/2023 0-5  0 - 5 RBC/hpf Final   WBC, UA 06/05/2023 0-5  0 - 5 WBC/hpf Final   Bacteria, UA 06/05/2023 NONE SEEN  NONE SEEN Final   Squamous Epithelial / HPF 06/05/2023 0-5  0 - 5 /HPF Final   Mucus 06/05/2023 PRESENT   Final   Performed at [facility], [address], [place], [place] [address]   Sed Rate 06/05/2023 1  0 - 16  mm/hr Final   Performed at [facility], [address] [place], [place] [address]   CRP 06/05/2023 1.6 (H)  <1.0 mg/dL Final   Performed at [facility], [address] [place], [place] [address]   Campylobacter species 06/06/2023 NOT DETECTED  NOT DETECTED Final   Plesimonas shigelloides 06/06/2023 NOT DETECTED  NOT DETECTED Final   Salmonella species 06/06/2023 NOT DETECTED  NOT DETECTED Final   Yersinia enterocolitica 06/06/2023 NOT DETECTED  NOT DETECTED Final   Vibrio species 06/06/2023 NOT DETECTED  NOT DETECTED Final   Vibrio cholerae 06/06/2023 NOT DETECTED  NOT DETECTED Final   Enteroaggregative E coli (EAEC) 06/06/2023 NOT DETECTED  NOT DETECTED Final   Enteropathogenic E coli (EPEC) 06/06/2023 NOT DETECTED  NOT DETECTED Final   Enterotoxigenic E coli (ETEC) 06/06/2023 NOT DETECTED  NOT DETECTED Final   Shiga like toxin producing E coli * 06/06/2023 NOT DETECTED  NOT DETECTED Final   Shigella/Enteroinvasive E coli (EI* 06/06/2023 NOT DETECTED  NOT DETECTED Final   Cryptosporidium 06/06/2023 NOT DETECTED  NOT DETECTED Final   Cyclospora cayetanensis 06/06/2023 NOT DETECTED  NOT DETECTED Final   Entamoeba histolytica 06/06/2023 NOT DETECTED  NOT DETECTED Final   Giardia lamblia 06/06/2023 NOT DETECTED  NOT DETECTED Final   Adenovirus F40/41 06/06/2023 NOT DETECTED  NOT DETECTED Final   Astrovirus 06/06/2023 NOT DETECTED  NOT DETECTED Final    Norovirus GI/GII 06/06/2023 NOT DETECTED  NOT DETECTED Final   Rotavirus A 06/06/2023 NOT DETECTED  NOT DETECTED Final   Sapovirus (I, II, IV, and V) 06/06/2023 NOT DETECTED  NOT DETECTED Final   Performed at [facility], [address] [place], [place] [address]   C Diff antigen 06/05/2023 NEGATIVE  NEGATIVE Final   C Diff toxin 06/05/2023 NEGATIVE  NEGATIVE Final   C Diff interpretation 06/05/2023 No C. difficile detected.   Final   Performed at [facility], [address], [place], [place] [address]   Specimen Description 06/05/2023    Final                   Value:BLOOD RIGHT HAND Performed at [facility], [address], [place], [place] [address]    Special Requests 06/05/2023    Final                   Value:Blood Culture results may not be optimal due to an inadequate volume of blood received in culture bottles BOTTLES DRAWN AEROBIC AND ANAEROBIC Performed at [facility], [address], [place], [place] [address]    Culture 06/05/2023    Final                   Value:NO GROWTH 5 DAYS Performed at [facility], [address] [place], [place] [address]    Report Status 06/05/2023 06/10/2023 FINAL   Final   Specimen Description 06/05/2023    Final                   Value:BLOOD LEFT ANTECUBITAL Performed at [facility], [address] [place], [place] [address]    Special Requests 06/05/2023    Final                   Value:Blood Culture results may not be optimal due to an inadequate volume of blood received in culture bottles BOTTLES DRAWN AEROBIC AND ANAEROBIC Performed at [facility], [phone]  [address], [place], [place] [address]    Culture 06/05/2023    Final                   Value:NO GROWTH 5 DAYS Performed at [facility], [address], [place], [place] [address]    Report Status 06/05/2023 06/10/2023 FINAL   Final   Calprotectin, Fecal 06/06/2023 39  0 -  120 ug/g Final   Comment: (NOTE) Concentration     Interpretation   Follow-Up < 5 - 50 ug/g     Normal           None >50 -120 ug/g     Borderline       Re-evaluate in 4-6 weeks    >120 ug/g     Abnormal         Repeat as clinically                                   indicated Performed At: [facility] [address] [place], [place] [id number] Pearlean Botts MD WG:[id number]    HIV Screen 4th Generation wRfx 06/06/2023 Non Reactive  Non Reactive Final   Performed at [facility], [address], [place], [place] [address]   WBC 06/06/2023 6.5  4.0 - 10.5 K/uL Final   RBC 06/06/2023 4.62  4.22 - 5.81 MIL/uL Final   Hemoglobin 06/06/2023 15.2  13.0 - 17.0 g/dL Final   HCT 62/95/2841 45.0  39.0 - 52.0 % Final   MCV 06/06/2023 97.4  80.0 - 100.0 fL Final   MCH 06/06/2023 32.9  26.0 - 34.0 pg Final   MCHC 06/06/2023 33.8  30.0 - 36.0 g/dL Final   RDW 32/44/0102 13.3  11.5 - 15.5 % Final   Platelets 06/06/2023 171  150 - 400 K/uL Final   nRBC 06/06/2023 0.0  0.0 - 0.2 % Final   Performed at [facility], [address] [place], [place] [address]   Sodium 06/06/2023 133 (L)  135 - 145 mmol/L Final   Potassium 06/06/2023 3.5  3.5 - 5.1 mmol/L Final   Chloride 06/06/2023 99  98 - 111 mmol/L Final   CO2 06/06/2023 23  22 - 32 mmol/L Final   Glucose, Bld 06/06/2023 90  70 - 99 mg/dL Final   Glucose reference range applies only to samples taken after fasting for at least 8 hours.   BUN 06/06/2023 8  6 - 20 mg/dL Final   Creatinine, Ser 06/06/2023 0.75  0.61 - 1.24 mg/dL Final   Calcium 64/40/3474 8.1 (L)  8.9 - 10.3 mg/dL Final   Total Protein 25/95/6387 5.7 (L)  6.5 - 8.1 g/dL Final   Albumin 56/43/3295 3.1 (L)  3.5 - 5.0 g/dL Final   AST 18/84/1660 68 (H)  15 - 41 U/L Final   ALT 06/06/2023 43  0 - 44 U/L Final   Alkaline Phosphatase 06/06/2023 46  38 - 126 U/L Final   Total Bilirubin 06/06/2023 1.4 (H)  0.0 - 1.2 mg/dL Final   GFR, Estimated 06/06/2023  >60  >60 mL/min Final   Comment: (NOTE) Calculated using the CKD-EPI Creatinine Equation (2021)    Anion gap 06/06/2023 11  5 - 15 Final   Performed at [facility], [address] [place], [place] [address]   Prothrombin Time 06/06/2023 14.0  11.4 - 15.2 seconds Final   INR 06/06/2023 1.1  0.8 - 1.2 Final   Comment: (NOTE) INR goal varies based on device and disease states. Performed at [facility], [address] [place], [place] [address]    CRP 06/07/2023 4.2 (H)  <1.0 mg/dL Final   Performed at [facility], [address], [place], [place] [address]   WBC 06/08/2023 6.8  4.0 - 10.5 K/uL Final   RBC 06/08/2023 4.65  4.22 - 5.81 MIL/uL Final   Hemoglobin 06/08/2023 15.7  13.0 - 17.0 g/dL Final   HCT 09/81/1914 45.1  39.0 - 52.0 % Final   MCV 06/08/2023 97.0  80.0 - 100.0 fL Final   MCH 06/08/2023 33.8  26.0 - 34.0 pg Final   MCHC 06/08/2023 34.8  30.0 - 36.0 g/dL Final   RDW 78/29/5621 13.3  11.5 - 15.5 % Final   Platelets 06/08/2023 187  150 - 400 K/uL Final   nRBC 06/08/2023 0.0  0.0 - 0.2 % Final   Performed at [facility], [address], [place], [place] [address]   Sodium 06/08/2023 132 (L)  135 - 145 mmol/L Final   Potassium 06/08/2023 4.0  3.5 - 5.1 mmol/L Final   Chloride 06/08/2023 97 (L)  98 - 111 mmol/L Final   CO2 06/08/2023 23  22 - 32 mmol/L Final   Glucose, Bld 06/08/2023 94  70 - 99 mg/dL Final   Glucose reference range applies only to samples taken after fasting for at least 8 hours.   BUN 06/08/2023 <5 (L)  6 - 20 mg/dL Final   Creatinine, Ser 06/08/2023 0.87  0.61 - 1.24 mg/dL Final   Calcium 78/46/9629 8.2 (L)  8.9 - 10.3 mg/dL Final   Total Protein 52/84/1324 5.7 (L)  6.5 - 8.1 g/dL Final   Albumin 40/11/2723 3.1 (L)  3.5 - 5.0 g/dL Final   AST 36/64/4034 29  15 - 41 U/L Final   ALT 06/08/2023 28  0 - 44 U/L Final   Alkaline Phosphatase 06/08/2023 51  38 - 126 U/L Final   Total  Bilirubin 06/08/2023 1.3 (H)  0.0 - 1.2 mg/dL Final   GFR, Estimated 06/08/2023 >60  >60 mL/min Final   Comment: (NOTE) Calculated using the CKD-EPI Creatinine Equation (2021)    Anion gap 06/08/2023 12  5 - 15 Final   Performed at [facility], [address], [place], [place] [address]   SURGICAL PATHOLOGY 06/08/2023    Final-Edited                   Value:SURGICAL PATHOLOGY CASE: [id number] PATIENT: Vickii Grand Surgical Pathology Report     Clinical History: Abdominal pain, diarrhea, abnormal bowel imaging on CT scan, R/O Crohn's disease (las)     FINAL MICROSCOPIC DIAGNOSIS:  A. COLON, TERMINAL ILIUM, BIOPSY: - Ileal mucosa with no specific histopathologic changes - Negative for acute inflammation, features of chronicity or granulomas  B. COLON, ASCENDING, BIOPSY: - Colonic mucosa with no specific histopathologic changes - Negative for acute inflammation, features of chronicity, granulomas or dysplasia  C. COLON, TRANSVERSE, BIOPSY: - Colonic mucosa with no specific histopathologic changes - Negative for acute inflammation, features of chronicity, granulomas or dysplasia  D. COLON, DESCENDING, BIOPSY: - Colonic mucosa with no specific histopathologic changes - Negative for acute inflammation, features of chronicity, granulomas or dysplasia  E. COLON, RECTO-SIGMOID, BIOPSY: - Focal low-grade ade  nomatous dysplasia, consistent with an incidental diminutive tubular adenoma - Background colonic mucosa with mild nonspecific hyperplastic changes - Negative for acute inflammation, features of chronicity or granulomas    GROSS DESCRIPTION:  A: Received in formalin are tan, soft tissue fragments that are submitted in toto. Number: 6 size: 0.2-0.5 cm blocks: 1  B: Received in formalin are tan, soft tissue fragments that are submitted in toto. Number: 5 size: 0.2-0.6 cm blocks: 1  C: Received in formalin are tan,  soft tissue fragments that are submitted in toto. Number: 6 size: 0.2-0.6 cm blocks: 1  D: Received in formalin are tan, soft tissue fragments that are submitted in toto. Number: 5 size: 0.2-0.6 cm blocks: 1  E: Received in formalin are tan, soft tissue fragments that are submitted in toto. Number: 6 size: 0.2-0.5 cm blocks: 1 (GRP 06/09/2023)   Final Diagnosis performed by Lee Public, MD.   Electronically signed 06/10/2023 Technical and / or Profe                         ssional components performed at [facility], [address] [place], [place] [address].  Immunohistochemistry Technical component (if applicable) was performed at [facility] [address], [place], [place] [address].   IMMUNOHISTOCHEMISTRY DISCLAIMER (if applicable): Some of these immunohistochemical stains may have been developed and the performance characteristics determine by [facility]. Some may not have been cleared or approved by the U.S. Food and Drug Administration. The FDA has determined that such clearance or approval is not necessary. This test is used for clinical purposes. It should not be regarded as investigational or for research. This laboratory is certified under the Clinical Laboratory Improvement Amendments of 1988 (CLIA-88) as qualified to perform high complexity clinical laboratory testing.  The controls stained appropriately.   IHC stains are performed on formalin fixed, paraffin embe                         dded tissue using a 3,3"diaminobenzidine (DAB) chromogen and Leica Bond Autostainer System. The staining intensity of the nucleus is score manually and is reported as the percentage of tumor cell nuclei demonstrating specific nuclear staining. The specimens are fixed in 10% Neutral Formalin for at least 6 hours and up to 72hrs. These tests are validated on decalcified tissue. Results should be interpreted with caution  given the possibility of false negative results on decalcified specimens. Antibody Clones are as follows ER-clone 50F, PR-clone 16, Ki67- clone MM1. Some of these immunohistochemical stains may have been developed and the performance characteristics determined by [facility].   Appointment on 06/03/2023  Component Date Value Ref Range Status   Iron 06/03/2023 159  42 - 165 ug/dL Final   Transferrin 09/81/1914 265.0  212.0 - 360.0 mg/dL Final   Saturation Ratios 06/03/2023 42.9  20.0 - 50.0 % Final   Ferritin 06/03/2023 199.7  22.0 - 322.0 ng/mL Final   TIBC 06/03/2023 371.0  250.0 - 450.0 mcg/dL Final   Sed Rate 78/29/5621 5  0 - 15 mm/hr Final   CRP 06/03/2023 1.5  0.5 - 20.0 mg/dL Final   Magnesium  06/03/2023 1.6  1.5 - 2.5 mg/dL Final   Sodium 30/86/5784 137  135 - 145 mEq/L Final   Potassium 06/03/2023 3.9  3.5 - 5.1 mEq/L Final   Chloride 06/03/2023 93 (L)  96 - 112 mEq/L Final   CO2 06/03/2023  32  19 - 32 mEq/L Final   Glucose, Bld 06/03/2023 107 (H)  70 - 99 mg/dL Final   BUN 16/11/9602 9  6 - 23 mg/dL Final   Creatinine, Ser 06/03/2023 1.01  0.40 - 1.50 mg/dL Final   Total Bilirubin 06/03/2023 1.3 (H)  0.2 - 1.2 mg/dL Final   Alkaline Phosphatase 06/03/2023 69  39 - 117 U/L Final   AST 06/03/2023 128 (H)  0 - 37 U/L Final   ALT 06/03/2023 64 (H)  0 - 53 U/L Final   Total Protein 06/03/2023 7.4  6.0 - 8.3 g/dL Final   Albumin 54/10/8117 4.6  3.5 - 5.2 g/dL Final   GFR 14/78/2956 87.46  >60.00 mL/min Final   Calculated using the CKD-EPI Creatinine Equation (2021)   Calcium 06/03/2023 9.8  8.4 - 10.5 mg/dL Final   WBC 21/30/8657 7.2  4.0 - 10.5 K/uL Final   RBC 06/03/2023 5.21  4.22 - 5.81 Mil/uL Final   Hemoglobin 06/03/2023 17.2 (H)  13.0 - 17.0 g/dL Final   HCT 84/69/6295 51.5  39.0 - 52.0 % Final   MCV 06/03/2023 98.8  78.0 - 100.0 fl Final   MCHC 06/03/2023 33.3  30.0 - 36.0 g/dL Final   RDW 28/41/3244 15.8 (H)  11.5 - 15.5 % Final   Platelets 06/03/2023 245.0   150.0 - 400.0 K/uL Final   Neutrophils Relative % 06/03/2023 80.4 (H)  43.0 - 77.0 % Final   Lymphocytes Relative 06/03/2023 9.4 (L)  12.0 - 46.0 % Final   Monocytes Relative 06/03/2023 9.4  3.0 - 12.0 % Final   Eosinophils Relative 06/03/2023 0.1  0.0 - 5.0 % Final   Basophils Relative 06/03/2023 0.7  0.0 - 3.0 % Final   Neutro Abs 06/03/2023 5.8  1.4 - 7.7 K/uL Final   Lymphs Abs 06/03/2023 0.7  0.7 - 4.0 K/uL Final   Monocytes Absolute 06/03/2023 0.7  0.1 - 1.0 K/uL Final   Eosinophils Absolute 06/03/2023 0.0  0.0 - 0.7 K/uL Final   Basophils Absolute 06/03/2023 0.0  0.0 - 0.1 K/uL Final  Admission on 06/01/2023, Discharged on 06/02/2023  Component Date Value Ref Range Status   Lipase 06/01/2023 37  11 - 51 U/L Final   Performed at [facility], [address] [place], [place] [address]   Sodium 06/01/2023 138  135 - 145 mmol/L Final   Potassium 06/01/2023 4.5  3.5 - 5.1 mmol/L Final   Chloride 06/01/2023 97 (L)  98 - 111 mmol/L Final   CO2 06/01/2023 25  22 - 32 mmol/L Final   Glucose, Bld 06/01/2023 122 (H)  70 - 99 mg/dL Final   Glucose reference range applies only to samples taken after fasting for at least 8 hours.   BUN 06/01/2023 8  6 - 20 mg/dL Final   Creatinine, Ser 06/01/2023 0.93  0.61 - 1.24 mg/dL Final   Calcium 25/36/6440 9.7  8.9 - 10.3 mg/dL Final   Total Protein 34/74/2595 7.6  6.5 - 8.1 g/dL Final   Albumin 63/87/5643 4.1  3.5 - 5.0 g/dL Final   AST 32/95/1884 98 (H)  15 - 41 U/L Final   ALT 06/01/2023 77 (H)  0 - 44 U/L Final   Alkaline Phosphatase 06/01/2023 80  38 - 126 U/L Final   Total Bilirubin 06/01/2023 1.5 (H)  0.0 - 1.2 mg/dL Final   GFR, Estimated 06/01/2023 >60  >60 mL/min Final   Comment: (NOTE) Calculated using the CKD-EPI Creatinine Equation (2021)  Anion gap 06/01/2023 16 (H)  5 - 15 Final   Performed at [facility], [address] [place], [place] [address]   WBC 06/01/2023 8.3  4.0 - 10.5 K/uL Final   RBC 06/01/2023 5.30  4.22 - 5.81  MIL/uL Final   Hemoglobin 06/01/2023 17.4 (H)  13.0 - 17.0 g/dL Final   HCT 19/14/7829 51.1  39.0 - 52.0 % Final   MCV 06/01/2023 96.4  80.0 - 100.0 fL Final   MCH 06/01/2023 32.8  26.0 - 34.0 pg Final   MCHC 06/01/2023 34.1  30.0 - 36.0 g/dL Final   RDW 56/21/3086 14.6  11.5 - 15.5 % Final   Platelets 06/01/2023 254  150 - 400 K/uL Final   nRBC 06/01/2023 0.0  0.0 - 0.2 % Final   Performed at [facility], [address], [place], [place] [address]   Color, Urine 06/01/2023 AMBER (A)  YELLOW Final   BIOCHEMICALS MAY BE AFFECTED BY COLOR   APPearance 06/01/2023 CLEAR  CLEAR Final   Specific Gravity, Urine 06/01/2023 1.027  1.005 - 1.030 Final   pH 06/01/2023 5.0  5.0 - 8.0 Final   Glucose, UA 06/01/2023 NEGATIVE  NEGATIVE mg/dL Final   Hgb urine dipstick 06/01/2023 NEGATIVE  NEGATIVE Final   Bilirubin Urine 06/01/2023 NEGATIVE  NEGATIVE Final   Ketones, ur 06/01/2023 20 (A)  NEGATIVE mg/dL Final   Protein, ur 96/29/5284 100 (A)  NEGATIVE mg/dL Final   Nitrite 13/24/4010 NEGATIVE  NEGATIVE Final   Leukocytes,Ua 06/01/2023 NEGATIVE  NEGATIVE Final   RBC / HPF 06/01/2023 0-5  0 - 5 RBC/hpf Final   WBC, UA 06/01/2023 0-5  0 - 5 WBC/hpf Final   Bacteria, UA 06/01/2023 NONE SEEN  NONE SEEN Final   Squamous Epithelial / HPF 06/01/2023 0-5  0 - 5 /HPF Final   Mucus 06/01/2023 PRESENT   Final   Performed at [facility], [address], [place], [place] [address]  Admission on 06/01/2023, Discharged on 06/01/2023  Component Date Value Ref Range Status   POCT Glucose ([facility]) 06/01/2023 135 (A)  70 - 99 mg/dL Final  Procedure visit on 05/20/2023  Component Date Value Ref Range Status   SURGICAL PATHOLOGY 05/20/2023    Final-Edited                   Value:SURGICAL PATHOLOGY [facility] [address] [place], [place] [address] Telephone [phone] or [phone] Fax [phone]  REPORT OF SURGICAL PATHOLOGY   Accession #: [id number] Patient Name:  Joesphine Schemm, PETTINGILL Visit # : [id number]  MRN: [id number] Physician: Harry Lindau DOB/Age [birth year] (Age: 78) Gender: M Collected Date: 05/20/2023 Received Date: 05/21/2023  FINAL DIAGNOSIS       1. Surgical [P], gastric random :       REACTIVE GASTROPATHY      NEGATIVE FOR H. PYLORI, INTESTINAL METAPLASIA, DYSPLASIA AND CARCINOMA       2. Surgical [P], distal esophagus :       REACTIVE SQUAMOUS MUCOSA WITH CHANGES SUGGESTIVE OF REFLUX ESOPHAGITIS      NEGATIVE FOR GLANDULAR EPITHELIUM, EOSINOPHILS, DYSPLASIA AND CARCINOMA       ELECTRONIC SIGNATURE : Picklesimer Md, Fred , Sports administrator, International aid/development worker  MICROSCOPIC DESCRIPTION  CASE COMMENTS STAINS USED IN DIAGNOSIS: H&E H&E    CLINICAL HISTORY  SPECIMEN(S) OB                         TAINED 1.  Surgical [P], Gastric Random 2. Surgical [P], Distal Esophagus  SPECIMEN COMMENTS: 1. Nausea; esophagitis; gastritis without bleeding, unspecified chronicity, unspecified gastritis type SPECIMEN CLINICAL INFORMATION: 1. R/O H.pylori 2. R/O barrett's esophagus    Gross Description 1. Received in formalin are tan, soft tissue fragments that are submitted in toto.Number: 2, Size: 0.3 cm smallest to 0.5 cm largest, (1B) ( TA ) 2. Received in formalin are tan, soft tissue fragments that are submitted in toto.Number: 2, Size: 0.3 cm smallest to 0.4 cm largest, (1B) ( TA )        Report signed out from the following location(s) [place] [address] [place], [place] [address] CLIA #: [id number]  [facility] [address] [place], [place] [address] CLIA #: [id number]   Appointment on 05/01/2023  Component Date Value Ref Range Status   Hep B S Ab 05/01/2023 NON-REACTIVE  NON-REACTIVE Final   Hepatitis A AB,Total 05/01/2023 NON-REACTIVE  NON-REACTIVE Final   Comment: . For additional information, please refer to  http://education.[facility].com/faq/FAQ202  (This  link is being provided for informational/ educational purposes only.) .    Hepatitis B Surface Ag 05/01/2023 NON-REACTIVE  NON-REACTIVE Final   Comment: . For additional information, please refer to  http://education.[facility].com/faq/FAQ202  (This link is being provided for informational/ educational purposes only.) .    Hepatitis C Ab 05/01/2023 NON-REACTIVE  NON-REACTIVE Final   Comment: . HCV antibody was non-reactive. There is no laboratory  evidence of HCV infection. . In most cases, no further action is required. However, if recent HCV exposure is suspected, a test for HCV RNA (test code 59563) is suggested. . For additional information please refer to http://education.[facility].com/faq/FAQ22v1 (This link is being provided for informational/ educational purposes only.) .    Ceruloplasmin 05/01/2023 27  14 - 30 mg/dL Final   Actin (Smooth Muscle) Antibody (IG* 05/01/2023 <20  <20 U Final   Comment: . Reference Range:    <20 U: Negative >or=20 U: Positive . Aaron Aas Antibodies recognizing actin are the main component of smooth muscle antibodies associated with auto- immune liver disease. Actin antibodies are found in approximately 75% of patients with autoimmune hepatitis (AIH) type 1, approximately 65% of patients with autoimmune cholangitis, approximately 30% of patients with primary biliary cirrhosis and approximately 2% of healthy controls. High values are closely correlated with AIH type 1. .    Anti Nuclear Antibody (ANA) 05/01/2023 NEGATIVE  NEGATIVE Final   Comment: ANA IFA is a first line screen for detecting the presence of up to approximately 150 autoantibodies in various autoimmune diseases. A negative ANA IFA result suggests an ANA-associated autoimmune disease is not present at this time, but is not definitive. If there is high clinical suspicion for Sjogren's syndrome, testing for anti-SS-A/Ro antibody should be considered. Anti-Jo-1  antibody should be considered for clinically suspected inflammatory myopathies. . AC-0: Negative . International Consensus on ANA Patterns ([email or web address]) . For additional information, please refer to http://education.[facility].com/faq/FAQ177 (This link is being provided for informational/ educational purposes only.) .    Mitochondrial M2 Ab, IgG 05/01/2023 <=20.0  <=20.0 U Final   Comment: . Reference Range:    Negative:  <=20.0      U    Equivocal: 20.1 - 24.9 U    Positive:  >=25.0      U .    A-1 Antitrypsin, Ser 05/01/2023 184  83 - 199 mg/dL Final   IgG (Immunoglobin G), Serum 05/01/2023 1,060  600 - 1,640 mg/dL Final  INR 05/01/2023 1.1 (H)  0.8 - 1.0 ratio Final   Prothrombin Time 05/01/2023 11.7  9.6 - 13.1 sec Final   IgM, Serum 05/01/2023 95  50 - 300 mg/dL Final   (tTG) Ab, IgA 16/11/9602 <1.0  U/mL Final   Comment: Value          Interpretation -----          -------------- <15.0          Antibody not detected > or = 15.0    Antibody detected .    Immunoglobulin A 05/01/2023 279  47 - 310 mg/dL Final   Total Bilirubin 05/01/2023 1.2  0.2 - 1.2 mg/dL Final   Bilirubin, Direct 05/01/2023 0.4 (H)  0.0 - 0.3 mg/dL Final   Alkaline Phosphatase 05/01/2023 65  39 - 117 U/L Final   AST 05/01/2023 181 (H)  0 - 37 U/L Final   ALT 05/01/2023 78 (H)  0 - 53 U/L Final   Total Protein 05/01/2023 7.9  6.0 - 8.3 g/dL Final   Albumin 54/10/8117 4.9  3.5 - 5.2 g/dL Final    Allergies: Dilaudid  [hydromorphone ] and Meperidine  Medications:  Facility Ordered Medications  Medication   acetaminophen  (TYLENOL ) tablet 650 mg   alum & mag hydroxide-simeth (MAALOX/MYLANTA) 200-200-20 MG/5ML suspension 30 mL   magnesium  hydroxide (MILK OF MAGNESIA) suspension 30 mL   haloperidol (HALDOL) tablet 5 mg   And   diphenhydrAMINE  (BENADRYL ) capsule 50 mg   haloperidol lactate (HALDOL) injection 5 mg   And   diphenhydrAMINE  (BENADRYL ) injection 50 mg    And   LORazepam (ATIVAN) injection 2 mg   haloperidol lactate (HALDOL) injection 10 mg   And   diphenhydrAMINE  (BENADRYL ) injection 50 mg   And   LORazepam (ATIVAN) injection 2 mg   hydrOXYzine (ATARAX) tablet 25 mg   traZODone (DESYREL) tablet 50 mg   [START ON 07/24/2023] nicotine (NICODERM CQ - dosed in mg/24 hours) patch 21 mg   thiamine (VITAMIN B1) injection 100 mg   [START ON 07/24/2023] thiamine (VITAMIN B1) tablet 100 mg   multivitamin with minerals tablet 1 tablet   ondansetron  (ZOFRAN -ODT) disintegrating tablet 4 mg   LORazepam (ATIVAN) tablet 1 mg   Followed by   Cecily Cohen ON 07/24/2023] LORazepam (ATIVAN) tablet 1 mg   Followed by   Cecily Cohen ON 07/25/2023] LORazepam (ATIVAN) tablet 1 mg   Followed by   Cecily Cohen ON 07/27/2023] LORazepam (ATIVAN) tablet 1 mg   PTA Medications  Medication Sig   tadalafil (CIALIS) 10 MG tablet Take 10 mg by mouth daily as needed for erectile dysfunction. (Patient not taking: Reported on 07/08/2023)   pantoprazole  (PROTONIX ) 40 MG tablet Take 1 tablet (40 mg total) by mouth 2 (two) times daily for 60 days, THEN 1 tablet (40 mg total) daily. Take Protonix  (pantoprazole ) 40 mg PO BID for 8 weeks, then reduce to 40 mg daily if upper GI. (Patient not taking: Reported on 07/08/2023)   ondansetron  (ZOFRAN -ODT) 4 MG disintegrating tablet Take 1 tablet (4 mg total) by mouth every 8 (eight) hours as needed for nausea or vomiting. (Patient not taking: Reported on 07/08/2023)   traMADol  (ULTRAM ) 50 MG tablet Take 1 tablet (50 mg total) by mouth every 6 (six) hours as needed for moderate pain (pain score 4-6) or severe pain (pain score 7-10). (Patient not taking: Reported on 07/08/2023)   acetaminophen  (TYLENOL ) 325 MG tablet Take 2 tablets (650 mg total) by mouth every 6 (six) hours as needed for  mild pain (pain score 1-3) (or Fever >/= 101). (Patient not taking: Reported on 07/08/2023)   methocarbamol  (ROBAXIN ) 500 MG tablet Take 1 tablet (500 mg total) by mouth every 6  (six) hours as needed for muscle spasms. (Patient not taking: Reported on 07/08/2023)   dicyclomine  (BENTYL ) 10 MG capsule Take 1 capsule (10 mg total) by mouth 4 (four) times daily as needed for spasms. (Patient not taking: Reported on 07/08/2023)   amoxicillin -clavulanate (AUGMENTIN ) 875-125 MG tablet Take 1 tablet by mouth every 12 (twelve) hours. (Patient not taking: Reported on 07/08/2023)   hydrocortisone  (ANUSOL -HC) 25 MG suppository Place 1 suppository (25 mg total) rectally at bedtime. (Patient not taking: Reported on 07/08/2023)      Medical Decision Making   Alcohol Use Disorder EtOH level: 356 on 6/3; LFTs: ALT 66 and AST 139 on 6/3; last use date: 6/3 -Ativan taper -CIWA with Ativan as needed for CIWA greater than 10 -Thiamine 100 mg IM first day and PO after that -Multivitamin with minerals daily -Tylenol  650 mg every 6 hours as needed for pain -Zofran  4 mg every 6 hours as needed for nausea or vomiting -Maalox/Mylanta 30 mL every 4 hours as needed for indigestion -Milk of Mag 30 mL as needed for constipation  Pending labs: CBC, CMP, EtOH, TSH, UDS  Recommendations  Based on my evaluation the patient does not appear to have an emergency medical condition.  Dispo: Obs unit and will transfer to [facility] pending bed availability  Saratha Cunas, MD PGY-2 Psychiatry

## 2023-07-23 NOTE — ED Notes (Signed)
 Pt is anxious with slight tremors. He is worried about his job. Supportive listening provided. Medicated with PRN med for anxiety, Provider notified

## 2023-07-23 NOTE — ED Notes (Signed)
Patient observed resting quietly, eyes closed. Respirations equal and unlabored. Will continue to monitor for safety.  

## 2023-07-23 NOTE — ED Notes (Addendum)
 Patient A&O x 4, cooperative, and self-reported anxiousness. Patient appears to be deep in thought and worried with a congruent affect. Patient reports that 2 days ago he passes out on a desk r/t drinking and was turning blue when they found him. Patient states he was transported to [facility] at that time. Patient endorses being unaware if he has seizures r/t ETOH withdrawal. Patient also endorses feeling, sad, depressed, ashamed, guilty, anxious, and worrying that his wife will leave him and loss of job. Patient observed with moderate body tremors and reports skin crawling, tingling, chills, and mild head fog. Supportive listening/ medication provided.

## 2023-07-23 NOTE — ED Notes (Signed)
 Provider notified of potasium 3.3.

## 2023-07-23 NOTE — Progress Notes (Signed)
   07/23/23 1229  [facility] Triage Screening (Walk-ins at [facility] only)  How Did You Hear About Us ? Family/Friend  What Is the Reason for Your Visit/Call Today? Jordan Smith presents to [facility] voluntarily accompanied by his wife. Pt states that he stopped drinking 2 days ago. Pt states that he would like to do an outpatient program as oppose to doing inpatient treatment. Pt states that he was drinking excessively before stopping. Pt states that he is having some tremors, chills, dehydration, decreased appetite and restless night. Pt currently denies SI, HI, AVH and alcohol/drug use.  How Long Has This Been Causing You Problems? <Week  Have You Recently Had Any Thoughts About Hurting Yourself? No  Are You Planning to Commit Suicide/Harm Yourself At This time? No  Have you Recently Had Thoughts About Hurting Someone Marigene Shoulder? No  Are You Planning To Harm Someone At This Time? No  Physical Abuse Denies  Verbal Abuse Denies  Sexual Abuse Denies  Exploitation of patient/patient's resources Denies  Self-Neglect Denies  Are you currently experiencing any auditory, visual or other hallucinations? No  Have You Used Any Alcohol or Drugs in the Past 24 Hours? No  Do you have any current medical co-morbidities that require immediate attention? No  Clinician description of patient physical appearance/behavior: calm, cooperative, groomed  What Do You Feel Would Help You the Most Today? Alcohol or Drug Use Treatment  If access to [facility] was not available, would you have sought care in the Emergency Department? Yes  Determination of Need Routine (7 days)  Options For Referral Chemical Dependency Intensive Outpatient Therapy (CDIOP);Outpatient Therapy;Partial Hospitalization;Facility-Based Crisis

## 2023-07-23 NOTE — ED Notes (Signed)
 Pt voices c/o anxiety with slight tremors. He states they improved slightly. He is concerned about losing his job and states he must dc on Sunday. Supportive listening provided. He denies SI/ HI/AVH. Will continue to monitor for safety. He is pleasant and engaged with staff.

## 2023-07-23 NOTE — BH Assessment (Signed)
 Comprehensive Clinical Assessment (CCA) Note  07/23/2023 Jordan Smith [id number]  DISPOSITION: Per Saratha Cunas MD pt is recommended for admission to [facility] for detox from alcohol  The patient demonstrates the following risk factors for suicide: Chronic risk factors for suicide include: substance use disorder. Acute risk factors for suicide include: family or marital conflict. Protective factors for this patient include: positive social support, responsibility to others (children, family), coping skills, and hope for the future. Considering these factors, the overall suicide risk at this point appears to be low. Patient is appropriate for outpatient follow up.   Per Triage assessment: "Jordan Smith presents to [facility] voluntarily accompanied by his wife. Pt states that he stopped drinking 2 days ago. Pt states that he would like to do an outpatient program as oppose to doing inpatient treatment. Pt states that he was drinking excessively before stopping. Pt states that he is having some tremors, chills, dehydration, decreased appetite and restless night. Pt currently denies SI, HI, AVH and alcohol/drug use. "   Chief Complaint:  Chief Complaint  Patient presents with   Alcohol Problem   Visit Diagnosis:  Alcohol Use d/o    CCA Screening, Triage and Referral (STR)  Patient Reported Information How did you hear about us ? Family/Friend  What Is the Reason for Your Visit/Call Today? Jordan Smith presents to [facility] voluntarily accompanied by his wife. Pt states that he stopped drinking 2 days ago. Pt states that he would like to do an outpatient program as oppose to doing inpatient treatment. Pt states that he was drinking excessively before stopping. Pt states that he is having some tremors, chills, dehydration, decreased appetite and restless night. Pt currently denies SI, HI, AVH and alcohol/drug use.  How Long Has This Been Causing You Problems? <Week  What Do You Feel Would Help You the  Most Today? Alcohol or Drug Use Treatment   Have You Recently Had Any Thoughts About Hurting Yourself? No  Are You Planning to Commit Suicide/Harm Yourself At This time? No   Flowsheet Row ED from 07/23/2023 in [facility] ED from 07/21/2023 in [facility] Emergency Department at [facility] ED to Hosp-Admission (Discharged) from 06/05/2023 in [facility]  C-SSRS RISK CATEGORY No Risk No Risk No Risk       Have you Recently Had Thoughts About Hurting Someone Jordan Smith? No  Are You Planning to Harm Someone at This Time? No  Explanation: na  Have You Used Any Alcohol or Drugs in the Past 24 Hours? No  How Long Ago Did You Use Drugs or Alcohol? 2 days ago What Did You Use and How Much? unknown  Do You Currently Have a Therapist/Psychiatrist? No  Name of Therapist/Psychiatrist:    Have You Been Recently Discharged From Any Office Practice or Programs? No  Explanation of Discharge From Practice/Program: na    CCA Screening Triage Referral Assessment Type of Contact: face-to-face Telemedicine Service Delivery:   Is this Initial or Reassessment?   Date Telepsych consult ordered in [facility]:    Time Telepsych consult ordered in [facility]:    Location of Assessment: [facility] Assessment Services  Provider Location: [facility] Assessment [facility]   Collateral Involvement: wife   Does Patient Have a Automotive engineer Guardian? No  Legal Guardian Contact Information: na  Copy of Legal Guardianship Form: -- (na)  Legal Guardian Notified of Arrival: -- (na)  Legal Guardian Notified of Pending Discharge: -- (na)  If Minor and  Not Living with Parent(s), Who has Custody? adult  Is CPS involved or ever been involved? Never (none reported)  Is APS involved or ever been involved? Never   Patient Determined To Be At Risk for Harm To Self or Others Based on Review of Patient Reported Information or Presenting Complaint?  No  Method: No Plan  Availability of Means: No access or NA  Intent: Vague intent or NA  Notification Required: No need or identified person  Additional Information for Danger to Others Potential: -- (na)  Additional Comments for Danger to Others Potential: na  Are There Guns or Other Weapons in Your Home? No  Types of Guns/Weapons: na  Are These Weapons Safely Secured?                            -- (na)  Who Could Verify You Are Able To Have These Secured: wife  Do You Have any Outstanding Charges, Pending Court Dates, Parole/Probation? none  Contacted To Inform of Risk of Harm To Self or Others: -- (na)    Does Patient Present under Involuntary Commitment? no   [place] of Residence: [place]   Patient Currently Receiving the Following Services: Not Receiving Services   Determination of Need: Urgent (48 hours) (Per Saratha Cunas MD pt is recommended for admission to [facility] for detox from alcohol)   Options For Referral: Facility-Based Crisis     CCA Biopsychosocial Patient Reported Schizophrenia/Schizoaffective Diagnosis in Past: No   Strengths: able to accept help   Mental Health Symptoms Depression:  None   Duration of Depressive symptoms:    Mania:  None   Anxiety:   Worrying   Psychosis:  None   Duration of Psychotic symptoms:    Trauma:  None (none reported)   Obsessions:  None   Compulsions:  Disrupts with routine/functioning; "Driven" to perform behaviors/acts; Intended to reduce stress or prevent another outcome; Repeated behaviors/mental acts (related to alcohol consumption)   Inattention:  None   Hyperactivity/Impulsivity:  None   Oppositional/Defiant Behaviors:  None   Emotional Irregularity:  None   Other Mood/Personality Symptoms:  none    Mental Status Exam Appearance and self-care  Stature:  Average   Weight:  Average weight   Clothing:  Casual; Neat/clean   Grooming:  Normal   Cosmetic use:  None   Posture/gait:   Normal   Motor activity:  Not Remarkable   Sensorium  Attention:  Normal   Concentration:  Normal   Orientation:  X5   Recall/memory:  Normal   Affect and Mood  Affect:  Full Range   Mood:  Euthymic   Relating  Eye contact:  Normal   Facial expression:  Responsive   Attitude toward examiner:  Cooperative   Thought and Language  Speech flow: Clear and Coherent   Thought content:  Appropriate to Mood and Circumstances   Preoccupation:  None   Hallucinations:  None   Organization:  Coherent   [employer] of Knowledge:  Average   Intelligence:  Average   Abstraction:  Functional   Judgement:  Normal   Reality Testing:  Adequate   Insight:  Good; Gaps   Decision Making:  Normal   Social Functioning  Social Maturity:  Responsible   Social Judgement:  Normal   Stress  Stressors:  Other (Comment) (alcohol consumption patterns)   Coping Ability:  Exhausted; Normal   Skill Deficits:  None   Supports:  Family  Religion: Religion/Spirituality Are You A Religious Person?: No How Might This Affect Treatment?: na  Leisure/Recreation: Leisure / Recreation Do You Have Hobbies?: No  Exercise/Diet: Exercise/Diet Do You Exercise?: No Have You Gained or Lost A Significant Amount of Weight in the Past Six Months?: No Do You Follow a Special Diet?: No Do You Have Any Trouble Sleeping?: No   CCA Employment/Education Employment/Work Situation: Employment / Work Situation Employment Situation: 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 Patient's Job has Been Impacted by Current Illness: No Has Patient ever Been in Equities trader?: No  Education: Education Is Patient Currently Attending School?: No Last Grade Completed: 14 Did You Product manager?: Yes What Type of College Degree Do you Have?: unknown Did You Have An Individualized Education Program (IIEP): No Did You Have Any Difficulty At [employer]?:  No Patient's Education Has Been Impacted by Current Illness: No   CCA Family/Childhood History Family and Relationship History: Family history Marital status: Married Number of Years Married:  (unknown) What types of issues is patient dealing with in the relationship?: alcohol consumption patterns Additional relationship information: na Does patient have children?: No  Childhood History:  Childhood History By whom was/is the patient raised?: Both parents Did patient suffer any verbal/emotional/physical/sexual abuse as a child?: No Did patient suffer from severe childhood neglect?: No Has patient ever been sexually abused/assaulted/raped as an adolescent or adult?: No Witnessed domestic violence?: No Has patient been affected by domestic violence as an adult?: No       CCA Substance Use Alcohol/Drug Use: Alcohol / Drug Use Pain Medications: see MAR Prescriptions: see MAR Over the Counter: see MAR History of alcohol / drug use?: Yes Longest period of sobriety (when/how long): unknown Negative Consequences of Use: Personal relationships Withdrawal Symptoms: Tremors, Fever / Chills, Other (Comment) (dehydration, decreased appetite and restless sleep) Substance #1 Name of Substance 1: alcohol 1 - Age of First Use: teen 1 - Amount (size/oz): 0.5 ml of liquor 1 - Frequency: 4 times per week 1 - Duration: ongoing 1 - Last Use / Amount: 2 days ago 1 - Method of Aquiring: purchase 1- Route of Use: drink                       ASAM's:  Six Dimensions of Multidimensional Assessment  Dimension 1:  Acute Intoxication and/or Withdrawal Potential:      Dimension 2:  Biomedical Conditions and Complications:   Dimension 2:  Description of patient's biomedical conditions and  complications: none reported  Dimension 3:  Emotional, Behavioral, or Cognitive Conditions and Complications:  Dimension 3:  Description of emotional, behavioral, or cognitive conditions and  complications: none reported  Dimension 4:  Readiness to Change:     Dimension 5:  Relapse, Continued use, or Continued Problem Potential:     Dimension 6:  Recovery/Living Environment:     ASAM Severity Score:    ASAM Recommended Level of Treatment: ASAM Recommended Level of Treatment: Level II Intensive Outpatient Treatment   Substance use Disorder (SUD) Substance Use Disorder (SUD)  Checklist Symptoms of Substance Use: Repeated use in physically hazardous situations, Continued use despite having a persistent/recurrent physical/psychological problem caused/exacerbated by use, Continued use despite persistent or recurrent social, interpersonal problems, caused or exacerbated by use, Recurrent use that results in a failure to fulfill major role obligations (work, school, home)  Recommendations for Services/Supports/Treatments: Recommendations for Services/Supports/Treatments Recommendations For Services/Supports/Treatments: IOP (Intensive Outpatient Program), CD-IOP Intensive Chemical Dependency Program, Detox, Facility  Based Crisis  Disposition Recommendation per psychiatric provider: We recommend transfer to [facility].and admission to [facility] for detox from alcohol   DSM5 Diagnoses: Patient Active Problem List   Diagnosis Date Noted   Abnormal CT scan, gastrointestinal tract 06/07/2023   Bilious vomiting with nausea 06/07/2023   Unintentional weight loss 06/07/2023   Enterocolitis 06/05/2023   Hypokalemia 06/05/2023   Hyponatremia 06/05/2023   Class 1 obesity 06/05/2023   Low testosterone 02/03/2023   Rosacea 02/03/2023   Heart palpitations 02/03/2023   Hepatic steatosis 04/13/2019   Mild intermittent asthma without complication 04/13/2019   Class 2 obesity due to excess calories with body mass index (BMI) of 35.0 to 35.9 in adult 04/13/2019   Unilateral inguinal hernia without obstruction or gangrene 03/22/2019   Reactive airway disease 03/22/2019    Erectile dysfunction due to arterial insufficiency 03/22/2019   Healthcare maintenance 03/22/2019   Elevated LFTs 03/22/2019   Close exposure to COVID-19 virus 03/22/2019   Elevated LDL cholesterol level 03/22/2019     Referrals to Alternative Service(s): Referred to Alternative Service(s):   Place:   Date:   Time:    Referred to Alternative Service(s):   Place:   Date:   Time:    Referred to Alternative Service(s):   Place:   Date:   Time:    Referred to Alternative Service(s):   Place:   Date:   Time:     Jordan Smith, Counselor

## 2023-07-24 ENCOUNTER — Other Ambulatory Visit (HOSPITAL_COMMUNITY)
Admission: EM | Admit: 2023-07-24 | Discharge: 2023-07-26 | Disposition: A | Discharge status: Home / Self Care | Attending: Student | Admitting: Student

## 2023-07-24 DIAGNOSIS — F1010 Alcohol abuse, uncomplicated: Secondary | ICD-10-CM | POA: Diagnosis not present

## 2023-07-24 DIAGNOSIS — F1020 Alcohol dependence, uncomplicated: Secondary | ICD-10-CM | POA: Diagnosis present

## 2023-07-24 DIAGNOSIS — F1090 Alcohol use, unspecified, uncomplicated: Secondary | ICD-10-CM | POA: Diagnosis present

## 2023-07-24 DIAGNOSIS — F411 Generalized anxiety disorder: Secondary | ICD-10-CM | POA: Diagnosis present

## 2023-07-24 DIAGNOSIS — F17200 Nicotine dependence, unspecified, uncomplicated: Secondary | ICD-10-CM | POA: Diagnosis present

## 2023-07-24 LAB — T4, FREE: Free T4: 0.84 ng/dL (ref 0.61–1.12)

## 2023-07-24 MED ORDER — THIAMINE MONONITRATE 100 MG PO TABS
100.0000 mg | ORAL_TABLET | Freq: Every day | ORAL | Status: DC
  Administered 2023-07-25 – 2023-07-26 (×2): 100 mg via ORAL
  Filled 2023-07-24 (×2): qty 1

## 2023-07-24 MED ORDER — DIPHENHYDRAMINE HCL 50 MG/ML IJ SOLN: 50.0000 mg | Freq: Three times a day (TID) | INTRAMUSCULAR | Status: DC | PRN

## 2023-07-24 MED ORDER — ONDANSETRON 4 MG PO TBDP: 4.0000 mg | ORAL_TABLET | Freq: Four times a day (QID) | ORAL | Status: DC | PRN

## 2023-07-24 MED ORDER — POTASSIUM CHLORIDE CRYS ER 20 MEQ PO TBCR: 30.0000 meq | EXTENDED_RELEASE_TABLET | Freq: Two times a day (BID) | ORAL | Status: DC

## 2023-07-24 MED ORDER — ACETAMINOPHEN 325 MG PO TABS: 650.0000 mg | ORAL_TABLET | Freq: Four times a day (QID) | ORAL | Status: DC | PRN

## 2023-07-24 MED ORDER — MAGNESIUM HYDROXIDE 400 MG/5ML PO SUSP: 30.0000 mL | Freq: Every day | ORAL | Status: DC | PRN

## 2023-07-24 MED ORDER — LORAZEPAM 1 MG PO TABS
1.0000 mg | ORAL_TABLET | Freq: Two times a day (BID) | ORAL | Status: AC
  Administered 2023-07-24 (×2): 1 mg via ORAL
  Filled 2023-07-24 (×2): qty 1

## 2023-07-24 MED ORDER — HALOPERIDOL 5 MG PO TABS: 5.0000 mg | ORAL_TABLET | Freq: Three times a day (TID) | ORAL | Status: DC | PRN

## 2023-07-24 MED ORDER — TRAZODONE HCL 50 MG PO TABS
50.0000 mg | ORAL_TABLET | Freq: Every evening | ORAL | Status: DC | PRN
Start: 2023-07-24 — End: 2023-07-26
  Administered 2023-07-24 – 2023-07-25 (×2): 50 mg via ORAL
  Filled 2023-07-24 (×2): qty 1

## 2023-07-24 MED ORDER — LORAZEPAM 2 MG/ML IJ SOLN: 2.0000 mg | Freq: Three times a day (TID) | INTRAMUSCULAR | Status: DC | PRN

## 2023-07-24 MED ORDER — LORAZEPAM 1 MG PO TABS
1.0000 mg | ORAL_TABLET | Freq: Once | ORAL | Status: AC
  Administered 2023-07-26: 1 mg via ORAL
  Filled 2023-07-24: qty 1

## 2023-07-24 MED ORDER — LORAZEPAM 1 MG PO TABS
1.0000 mg | ORAL_TABLET | Freq: Two times a day (BID) | ORAL | Status: AC
  Administered 2023-07-25 (×2): 1 mg via ORAL
  Filled 2023-07-24 (×2): qty 1

## 2023-07-24 MED ORDER — DIPHENHYDRAMINE HCL 50 MG PO CAPS: 50.0000 mg | ORAL_CAPSULE | Freq: Three times a day (TID) | ORAL | Status: DC | PRN

## 2023-07-24 MED ORDER — HALOPERIDOL LACTATE 5 MG/ML IJ SOLN: 5.0000 mg | Freq: Three times a day (TID) | INTRAMUSCULAR | Status: DC | PRN

## 2023-07-24 MED ORDER — LORAZEPAM 1 MG PO TABS
1.0000 mg | ORAL_TABLET | Freq: Four times a day (QID) | ORAL | Status: DC | PRN
Start: 2023-07-24 — End: 2023-07-26

## 2023-07-24 MED ORDER — ADULT MULTIVITAMIN W/MINERALS CH
1.0000 | ORAL_TABLET | Freq: Every day | ORAL | Status: DC
  Administered 2023-07-24 – 2023-07-26 (×3): 1 via ORAL
  Filled 2023-07-24 (×3): qty 1

## 2023-07-24 MED ORDER — ALUM & MAG HYDROXIDE-SIMETH 200-200-20 MG/5ML PO SUSP
30.0000 mL | ORAL | Status: DC | PRN
Start: 2023-07-24 — End: 2023-07-26
  Administered 2023-07-24: 30 mL via ORAL
  Filled 2023-07-24: qty 30

## 2023-07-24 MED ORDER — POTASSIUM CHLORIDE 20 MEQ PO PACK
40.0000 meq | PACK | Freq: Two times a day (BID) | ORAL | Status: AC
  Administered 2023-07-24 – 2023-07-25 (×2): 40 meq via ORAL
  Filled 2023-07-24 (×2): qty 2

## 2023-07-24 MED ORDER — HYDROXYZINE HCL 25 MG PO TABS
25.0000 mg | ORAL_TABLET | Freq: Three times a day (TID) | ORAL | Status: DC | PRN
Start: 2023-07-24 — End: 2023-07-26
  Administered 2023-07-24 – 2023-07-26 (×2): 25 mg via ORAL
  Filled 2023-07-24 (×2): qty 1

## 2023-07-24 MED ORDER — NICOTINE 21 MG/24HR TD PT24
21.0000 mg | MEDICATED_PATCH | Freq: Every day | TRANSDERMAL | Status: DC
  Administered 2023-07-24 – 2023-07-26 (×3): 21 mg via TRANSDERMAL
  Filled 2023-07-24 (×3): qty 1

## 2023-07-24 MED ORDER — HALOPERIDOL LACTATE 5 MG/ML IJ SOLN: 10.0000 mg | Freq: Three times a day (TID) | INTRAMUSCULAR | Status: DC | PRN

## 2023-07-24 NOTE — Group Note (Signed)
 Group Topic: Communication  Group Date: 07/24/2023 Start Time: 1000 End Time: 1045 Facilitators: Ashby Blackwater, NT  Department: [facility]  Number of Participants: 11  Group Focus: communication, problem solving, self-awareness, and social skills Treatment Modality:  Skills Training Interventions utilized were group exercise Purpose: improve communication skills, regain self-worth, and reinforce self-care  Name: Jordan Smith Date of Birth: 1975-09-22  MR: [id number]    Did not attend Patients Problems:  Patient Active Problem List   Diagnosis Date Noted   Alcohol use disorder 07/24/2023   Abnormal CT scan, gastrointestinal tract 06/07/2023   Bilious vomiting with nausea 06/07/2023   Unintentional weight loss 06/07/2023   Enterocolitis 06/05/2023   Hypokalemia 06/05/2023   Hyponatremia 06/05/2023   Class 1 obesity 06/05/2023   Low testosterone 02/03/2023   Rosacea 02/03/2023   Heart palpitations 02/03/2023   Hepatic steatosis 04/13/2019   Mild intermittent asthma without complication 04/13/2019   Class 2 obesity due to excess calories with body mass index (BMI) of 35.0 to 35.9 in adult 04/13/2019   Unilateral inguinal hernia without obstruction or gangrene 03/22/2019   Reactive airway disease 03/22/2019   Erectile dysfunction due to arterial insufficiency 03/22/2019   Healthcare maintenance 03/22/2019   Elevated LFTs 03/22/2019   Close exposure to COVID-19 virus 03/22/2019   Elevated LDL cholesterol level 03/22/2019

## 2023-07-24 NOTE — ED Notes (Signed)
Patient discharged to [facility]

## 2023-07-24 NOTE — ED Notes (Signed)
 Pt is observed watching television and interacting with others in the dayroom. Pt denies SI/HI/AVH. Pt c/o anxiety, sweats, slight tremor, and restlessness. PRN Atarax and Trazodone given as per orders. Last CIWA was 4 at 1946. Pt is med compliant. Pt is safe on the unit at this time.

## 2023-07-24 NOTE — Group Note (Signed)
 Group Topic: Fears and Unhealthy Coping Skills  Group Date: 07/24/2023 Start Time: 2030 End Time: 2100 Facilitators: Alvino Joseph, NT  Department: [facility]  Number of Participants: 8  Group Focus: triggers Treatment Modality:  Individual Therapy Interventions utilized were group exercise Purpose: trigger / craving management  Name: Jordan Smith Date of Birth: 07/24/1975  MR: [id number]    Level of Participation: none Quality of Participation: n/a Interactions with others: n/a Mood/Affect: n/a Triggers (if applicable): n/a Cognition: n/a Progress: Other Response: n/a Plan: patient will be encouraged to attend group  Patients Problems:  Patient Active Problem List   Diagnosis Date Noted   Alcohol use disorder 07/24/2023   Abnormal CT scan, gastrointestinal tract 06/07/2023   Bilious vomiting with nausea 06/07/2023   Unintentional weight loss 06/07/2023   Enterocolitis 06/05/2023   Hypokalemia 06/05/2023   Hyponatremia 06/05/2023   Class 1 obesity 06/05/2023   Low testosterone 02/03/2023   Rosacea 02/03/2023   Heart palpitations 02/03/2023   Hepatic steatosis 04/13/2019   Mild intermittent asthma without complication 04/13/2019   Class 2 obesity due to excess calories with body mass index (BMI) of 35.0 to 35.9 in adult 04/13/2019   Unilateral inguinal hernia without obstruction or gangrene 03/22/2019   Reactive airway disease 03/22/2019   Erectile dysfunction due to arterial insufficiency 03/22/2019   Healthcare maintenance 03/22/2019   Elevated LFTs 03/22/2019   Close exposure to COVID-19 virus 03/22/2019   Elevated LDL cholesterol level 03/22/2019

## 2023-07-24 NOTE — Group Note (Signed)
 Group Topic: Healthy Self Image and Positive Change  Group Date: 07/24/2023 Start Time: 1210 End Time: 1240 Facilitators: Erma Hay, NT  Department: [facility]  Number of Participants: 9  Group Focus: community group Treatment Modality:  Patient-Centered Therapy Interventions utilized were group exercise Purpose: express feelings and increase insight  Name: Jordan Smith Date of Birth: Sep 19, 1975  MR: [id number]    Level of Participation: active Quality of Participation: cooperative Interactions with others: gave feedback Mood/Affect: appropriate Triggers (if applicable):  Cognition: insightful Progress: Gaining insight Response:  Plan: patient will be encouraged to to continue being grateful and having a positive outlook on life.  Patients Problems:  Patient Active Problem List   Diagnosis Date Noted   Alcohol use disorder 07/24/2023   Abnormal CT scan, gastrointestinal tract 06/07/2023   Bilious vomiting with nausea 06/07/2023   Unintentional weight loss 06/07/2023   Enterocolitis 06/05/2023   Hypokalemia 06/05/2023   Hyponatremia 06/05/2023   Class 1 obesity 06/05/2023   Low testosterone 02/03/2023   Rosacea 02/03/2023   Heart palpitations 02/03/2023   Hepatic steatosis 04/13/2019   Mild intermittent asthma without complication 04/13/2019   Class 2 obesity due to excess calories with body mass index (BMI) of 35.0 to 35.9 in adult 04/13/2019   Unilateral inguinal hernia without obstruction or gangrene 03/22/2019   Reactive airway disease 03/22/2019   Erectile dysfunction due to arterial insufficiency 03/22/2019   Healthcare maintenance 03/22/2019   Elevated LFTs 03/22/2019   Close exposure to COVID-19 virus 03/22/2019   Elevated LDL cholesterol level 03/22/2019

## 2023-07-24 NOTE — ED Notes (Signed)
 Pt observed/assessed in recliner sleeping. RR even and unlabored, appearing in no noted distress. Environmental check complete, will continue to monitor for safety

## 2023-07-24 NOTE — ED Notes (Signed)
 Patient sitting in lounger, quite, eating breakfast.

## 2023-07-24 NOTE — ED Notes (Signed)
 Patient admitted to [facility] fro detox from ETOH.  Patient calm and cooperative with admission process.  He was oriented to unit and room and informed of unit rules and routines.  Patient states he can stay through the weekend but needs to be discharged by Sunday due to needing to be back at work on Monday.  Patient given AA meeting list and educated regarding the importance of attendance.  He is also requesting out patient treatment. Patient has tremors and ciwa is a 4.  Will monitor.

## 2023-07-24 NOTE — ED Provider Notes (Signed)
 FBC/OBS ASAP Discharge Summary  Date and Time: 07/24/2023 10:02 AM  Name: Jordan Smith  MRN:  [id number]   Discharge Diagnoses:  Final diagnoses:  Alcohol use disorder    Subjective: Jordan Smith is a 48 year old male with a past psychiatric history who AUD who presents to [facility] accompanied by his wife for alcohol detox. Patient seen in bed, no acute distress. He denies SI, HI, and AVH. He continues to be agreeable to transferring to [facility] when bed is available.  Stay Summary: The patient was evaluated each day by a clinical provider to ascertain response to treatment. Improvement was noted by the patient's report of decreasing symptoms, improved sleep and appetite, affect, medication tolerance, behavior, and participation in unit programming.  Patient was asked each day to complete a self inventory noting mood, mental status, pain, new symptoms, anxiety and concerns.  The patient's medications were managed with the following directions: -started ativan taper  Patient responded well to medication and being in a therapeutic and supportive environment. Positive and appropriate behavior was noted and the patient was motivated for recovery. The patient worked closely with the treatment team and case manager to develop a discharge plan with appropriate goals. Coping skills, problem solving as well as relaxation therapies were also part of the unit programming. Patient has denied SI and HI for over 48 hours.    Total Time spent with patient: 20 minutes  Past Psychiatric History:  Dx: AUD Denies current psychotropic medications Therapy: better health No prior psychiatric hospitalizations   Past Medical History: HLD, HTN   Family History: denies psychiatric history   Social History: lives with wife and daughter 832 years old), currently a Production designer, theatre/television/film  Current Medications:  Current Facility-Administered Medications  Medication Dose Route Frequency Provider Last Rate Last Admin   acetaminophen   (TYLENOL ) tablet 650 mg  650 mg Oral Q6H PRN Kristl Morioka B, MD       alum & mag hydroxide-simeth (MAALOX/MYLANTA) 200-200-20 MG/5ML suspension 30 mL  30 mL Oral Q4H PRN Katye Valek B, MD       haloperidol (HALDOL) tablet 5 mg  5 mg Oral TID PRN Olawale Marney B, MD       And   diphenhydrAMINE  (BENADRYL ) capsule 50 mg  50 mg Oral TID PRN Levis Nazir B, MD       haloperidol lactate (HALDOL) injection 5 mg  5 mg Intramuscular TID PRN Tabatha Razzano B, MD       And   diphenhydrAMINE  (BENADRYL ) injection 50 mg  50 mg Intramuscular TID PRN Prisma Decarlo B, MD       And   LORazepam (ATIVAN) injection 2 mg  2 mg Intramuscular TID PRN Malynn Lucy B, MD       haloperidol lactate (HALDOL) injection 10 mg  10 mg Intramuscular TID PRN Brownie Nehme B, MD       And   diphenhydrAMINE  (BENADRYL ) injection 50 mg  50 mg Intramuscular TID PRN Kaleiyah Polsky B, MD       And   LORazepam (ATIVAN) injection 2 mg  2 mg Intramuscular TID PRN Antara Brecheisen B, MD       hydrOXYzine (ATARAX) tablet 25 mg  25 mg Oral TID PRN Christien Berthelot B, MD   25 mg at 07/23/23 2042   LORazepam (ATIVAN) tablet 1 mg  1 mg Oral TID Raphaela Cannaday B, MD       Followed by   Cecily Cohen ON 07/25/2023] LORazepam (ATIVAN) tablet 1 mg  1  mg Oral BID Ester Mabe B, MD       Followed by   Cecily Cohen ON 07/27/2023] LORazepam (ATIVAN) tablet 1 mg  1 mg Oral Daily Shaquoya Cosper B, MD       magnesium  hydroxide (MILK OF MAGNESIA) suspension 30 mL  30 mL Oral Daily PRN Mehlani Blankenburg B, MD       multivitamin with minerals tablet 1 tablet  1 tablet Oral Daily Eliel Dudding B, MD   1 tablet at 07/24/23 0919   nicotine (NICODERM CQ - dosed in mg/24 hours) patch 21 mg  21 mg Transdermal Q0600 Analena Gama B, MD   21 mg at 07/24/23 0920   nicotine (NICODERM CQ - dosed in mg/24 hours) patch 21 mg  21 mg Transdermal Once McCarty, Artie, MD   21 mg at 07/23/23 1829   ondansetron  (ZOFRAN -ODT) disintegrating tablet 4 mg  4 mg Oral Q6H PRN Reiley Bertagnolli,  [place] Boccio B, MD       thiamine (VITAMIN B1) tablet 100 mg  100 mg Oral Daily Cacey Willow B, MD   100 mg at 07/24/23 0919   traZODone (DESYREL) tablet 50 mg  50 mg Oral QHS PRN Arlethia Basso B, MD   50 mg at 07/23/23 2122   No current outpatient medications on file.    PTA Medications:  Facility Ordered Medications  Medication   acetaminophen  (TYLENOL ) tablet 650 mg   alum & mag hydroxide-simeth (MAALOX/MYLANTA) 200-200-20 MG/5ML suspension 30 mL   magnesium  hydroxide (MILK OF MAGNESIA) suspension 30 mL   haloperidol (HALDOL) tablet 5 mg   And   diphenhydrAMINE  (BENADRYL ) capsule 50 mg   haloperidol lactate (HALDOL) injection 5 mg   And   diphenhydrAMINE  (BENADRYL ) injection 50 mg   And   LORazepam (ATIVAN) injection 2 mg   haloperidol lactate (HALDOL) injection 10 mg   And   diphenhydrAMINE  (BENADRYL ) injection 50 mg   And   LORazepam (ATIVAN) injection 2 mg   hydrOXYzine (ATARAX) tablet 25 mg   traZODone (DESYREL) tablet 50 mg   nicotine (NICODERM CQ - dosed in mg/24 hours) patch 21 mg   [COMPLETED] thiamine (VITAMIN B1) injection 100 mg   thiamine (VITAMIN B1) tablet 100 mg   multivitamin with minerals tablet 1 tablet   ondansetron  (ZOFRAN -ODT) disintegrating tablet 4 mg   [COMPLETED] LORazepam (ATIVAN) tablet 1 mg   Followed by   LORazepam (ATIVAN) tablet 1 mg   Followed by   Cecily Cohen ON 07/25/2023] LORazepam (ATIVAN) tablet 1 mg   Followed by   Cecily Cohen ON 07/27/2023] LORazepam (ATIVAN) tablet 1 mg   nicotine (NICODERM CQ - dosed in mg/24 hours) patch 21 mg   [COMPLETED] cloNIDine (CATAPRES) tablet 0.1 mg   [COMPLETED] potassium chloride  SA (KLOR-CON  M) CR tablet 40 mEq       03/22/2019    9:18 AM 03/22/2019    8:57 AM  Depression screen PHQ 2/9  Decreased Interest 0 0  Down, Depressed, Hopeless 0 0  PHQ - 2 Score 0 0  Altered sleeping 0   Tired, decreased energy 0   Change in appetite 0   Feeling bad or failure about yourself  0   Trouble concentrating 0   Moving  slowly or fidgety/restless 0   Suicidal thoughts 0   PHQ-9 Score 0     Flowsheet Row ED from 07/23/2023 in [facility] ED from 07/21/2023 in [facility] Emergency Department at [facility] ED to Hosp-Admission (Discharged) from 06/05/2023 in [place]   HOSPITAL-5 WEST GENERAL SURGERY  C-SSRS RISK CATEGORY No Risk No Risk No Risk       Musculoskeletal  Strength & Muscle Tone: within normal limits Gait & Station: normal Patient leans: N/A  Psychiatric Specialty Exam  Presentation  General Appearance:  Appropriate for Environment; Casual; Fairly Groomed  Eye Contact: Fair  Speech: Clear and Coherent; Normal Rate  Speech Volume: Normal  Handedness: Right   Mood and Affect  Mood: Depressed  Affect: Congruent   Thought Process  Thought Processes: Coherent; Linear  Descriptions of Associations:Intact  Orientation:Full (Time, Place and Person)  Thought Content:Logical; WDL  Diagnosis of Schizophrenia or Schizoaffective disorder in past: No    Hallucinations:Hallucinations: None  Ideas of Reference:None  Suicidal Thoughts:Suicidal Thoughts: No  Homicidal Thoughts:Homicidal Thoughts: No   Sensorium  Memory: Remote Good  Judgment: Fair  Insight: Fair   Art therapist  Concentration: Good  Attention Span: Good  Recall: Good  Fund of Knowledge: Good  Language: Good   Psychomotor Activity  Psychomotor Activity: Psychomotor Activity: Normal   Assets  Assets: Communication Skills; Resilience   Sleep  Sleep: Sleep: Fair   Nutritional Assessment (For OBS and FBC admissions only) Has the patient had a weight loss or gain of 10 pounds or more in the last 3 months?: No Has the patient had a decrease in food intake/or appetite?: No Does the patient have dental problems?: No Does the patient have eating habits or behaviors that may be indicators of an eating disorder including binging  or inducing vomiting?: No Has the patient recently lost weight without trying?: 0 Has the patient been eating poorly because of a decreased appetite?: 0 Malnutrition Screening Tool Score: 0    Physical Exam  Physical Exam Vitals reviewed.  Constitutional:      Appearance: Normal appearance.  HENT:     Head: Normocephalic and atraumatic.  Cardiovascular:     Rate and Rhythm: Normal rate.  Pulmonary:     Effort: Pulmonary effort is normal.  Neurological:     General: No focal deficit present.     Mental Status: He is alert and oriented to person, place, and time.    Review of Systems  Constitutional:  Negative for chills and fever.  Respiratory:  Negative for shortness of breath.   Cardiovascular:  Negative for chest pain and palpitations.  Gastrointestinal:  Negative for nausea and vomiting.  Neurological:  Negative for headaches.   Blood pressure 123/85, pulse 93, temperature 98.7 F (37.1 C), temperature source Oral, resp. rate 18, SpO2 98%. There is no height or weight on file to calculate BMI.  Demographic Factors:  Male and Caucasian  Loss Factors: Loss of significant relationship  Historical Factors: Impulsivity  Risk Reduction Factors:   Responsible for children under 49 years of age, Sense of responsibility to family, Employed, Living with another person, especially a relative, and Positive social support  Continued Clinical Symptoms:  Alcohol/Substance Abuse/Dependencies  Cognitive Features That Contribute To Risk:  Closed-mindedness    Suicide Risk:  Mild:  Suicidal ideation of limited frequency, intensity, duration, and specificity.  There are no identifiable plans, no associated intent, mild dysphoria and related symptoms, good self-control (both objective and subjective assessment), few other risk factors, and identifiable protective factors, including available and accessible social support.  Plan Of Care/Follow-up recommendations:  Activity as  tolerated Regular diet Continue prescription medications See PCP for medical conditions   Disposition: FBC 6/6  Joice Nares, MD 07/24/2023, 10:02 AM

## 2023-07-25 ENCOUNTER — Encounter (HOSPITAL_COMMUNITY): Payer: Self-pay | Admitting: Psychiatry

## 2023-07-25 DIAGNOSIS — F1090 Alcohol use, unspecified, uncomplicated: Secondary | ICD-10-CM | POA: Diagnosis present

## 2023-07-25 DIAGNOSIS — F17200 Nicotine dependence, unspecified, uncomplicated: Secondary | ICD-10-CM | POA: Diagnosis present

## 2023-07-25 DIAGNOSIS — F411 Generalized anxiety disorder: Secondary | ICD-10-CM | POA: Diagnosis present

## 2023-07-25 DIAGNOSIS — F1020 Alcohol dependence, uncomplicated: Secondary | ICD-10-CM | POA: Diagnosis not present

## 2023-07-25 LAB — BASIC METABOLIC PANEL WITH GFR
Anion gap: 10 (ref 5–15)
BUN: 7 mg/dL (ref 6–20)
CO2: 26 mmol/L (ref 22–32)
Calcium: 9.2 mg/dL (ref 8.9–10.3)
Chloride: 99 mmol/L (ref 98–111)
Creatinine, Ser: 0.76 mg/dL (ref 0.61–1.24)
GFR, Estimated: >60 mL/min (ref >60)
Glucose, Bld: 89 mg/dL (ref 70–99)
Potassium: 3.5 mmol/L (ref 3.5–5.1)
Sodium: 135 mmol/L (ref 135–145)

## 2023-07-25 LAB — T3, FREE: T3, Free: 3.4 pg/mL (ref 2.0–4.4)

## 2023-07-25 MED ORDER — NALTREXONE HCL 50 MG PO TABS: 50.0000 mg | ORAL_TABLET | Freq: Every day | ORAL | Status: DC

## 2023-07-25 MED ORDER — ESCITALOPRAM OXALATE 10 MG PO TABS
10.0000 mg | ORAL_TABLET | Freq: Every morning | ORAL | Status: DC
  Administered 2023-07-25 – 2023-07-26 (×2): 10 mg via ORAL
  Filled 2023-07-25 (×2): qty 1

## 2023-07-25 MED ORDER — NALTREXONE HCL 50 MG PO TABS
25.0000 mg | ORAL_TABLET | Freq: Every day | ORAL | Status: AC
  Administered 2023-07-25: 25 mg via ORAL
  Filled 2023-07-25: qty 1

## 2023-07-25 NOTE — Group Note (Signed)
 Group Topic: Relaxation  Group Date: 07/25/2023 Start Time: 1215 End Time: 1250 Facilitators: Erma Hay, NT  Department: [facility]  Number of Participants: 7  Group Focus: relaxation Treatment Modality:  Patient-Centered Therapy Interventions utilized were group exercise Purpose: reinforce self-care  Name: Jordan Smith Date of Birth: 10-07-1975  MR: [id number]    Level of Participation: active Quality of Participation: cooperative Interactions with others: gave feedback Mood/Affect: appropriate Triggers (if applicable):  Cognition: coherent/clear Progress: Moderate Response:  Plan: patient will be encouraged to engage in outdoor activities to promote relaxation, social interaction, and positive behavioral reinforcement in a natural setting.  Patients Problems:  Patient Active Problem List   Diagnosis Date Noted   Nicotine  use disorder 07/25/2023   GAD (generalized anxiety disorder) 07/25/2023   Alcohol use disorder, severe, dependence (HCC) 07/24/2023   Abnormal CT scan, gastrointestinal tract 06/07/2023   Bilious vomiting with nausea 06/07/2023   Unintentional weight loss 06/07/2023   Enterocolitis 06/05/2023   Hypokalemia 06/05/2023   Hyponatremia 06/05/2023   Class 1 obesity 06/05/2023   Low testosterone 02/03/2023   Rosacea 02/03/2023   Heart palpitations 02/03/2023   Hepatic steatosis 04/13/2019   Mild intermittent asthma without complication 04/13/2019   Class 2 obesity due to excess calories with body mass index (BMI) of 35.0 to 35.9 in adult 04/13/2019   Unilateral inguinal hernia without obstruction or gangrene 03/22/2019   Reactive airway disease 03/22/2019   Erectile dysfunction due to arterial insufficiency 03/22/2019   Healthcare maintenance 03/22/2019   Elevated LFTs 03/22/2019   Close exposure to COVID-19 virus 03/22/2019   Elevated LDL cholesterol level 03/22/2019

## 2023-07-25 NOTE — ED Provider Notes (Signed)
 Behavioral Health Progress Note  Date and Time: 07/25/2023 2:30 PM Name: Jordan Smith MRN:  [id number]  Subjective:  Fowler Antos is a 48 y.o. male with PMH of AUD (no sz or DTs), nicotine  use d/o, who arrived to [facility] (07/21/2023) via EMS as a code stroke, but workup found negative, then presented to [facility] (07/23/2023) with wife to be admitted to [facility] the same day for etoh detox. Started on ativan  taper, no significant withdrawal sxs aside from anxiety.   Patient was initially seen in the dayroom watching tv, no acute distress, engaged and pleasant with eval. He feels "anxious" and reported having multiple panic attacks that started before regular drinking. Also has significant cravings for etoh. He drinks because he is avoiding marital conflicts, worried that his wife may leave him, which is the wake up call that he needs. He plans on seeking treatment inpatient, however his priority is maintaining his job. He's a Production designer, theatre/television/film at [employer]. He wants to go home sunday by lunch time so he can get his things set in order and tell his boss that he will be going on a 1 week vacation. Stated that [facility] plans [facility]. Never been to residential or detox.  For the anxiety, has never been on psychotropics. Never been on rx for etoh. Amenable to starting lexapro and naltrexone after discussing the risk, benefits, side effects. For the panic attacks, made him aware of prn vistaril  that is available to him. However if he feels that vistaril  didn't work well, could trial propranolol.  Amenable to staying another day to ensure tolerance to new meds before dc'ing him tomorrow. Wants something for smoking cessation, hasn't tried wellbutrin or chantix. Sleep is ok, middle insomnia, up due to ruminating thoughts. Appetite intact. Denied active and passive SI, HI, AVH  Diagnosis:  Final diagnoses:  Alcohol use disorder, severe, dependence (HCC)  GAD (generalized anxiety disorder)  Nicotine  use disorder     Total Time spent with patient: 30 minutes  Past Psychiatric History:  Dx: AUD, nicotine  use d/o Denies current or past psychotropic medications Therapy: better health No prior psychiatric hospitalizations   Past Medical History: HLD, HTN, OSA   Family History: denies psychiatric history   Social History: lives with wife and daughter (68 years old), currently a Production designer, theatre/television/film at [employer]     Additional Social History:                         Sleep: see subjective  Appetite:  see subjective  Current Medications:  Current Facility-Administered Medications  Medication Dose Route Frequency Provider Last Rate Last Admin   acetaminophen  (TYLENOL ) tablet 650 mg  650 mg Oral Q6H PRN Hoang, Daniela B, MD       alum & mag hydroxide-simeth (MAALOX/MYLANTA) 200-200-20 MG/5ML suspension 30 mL  30 mL Oral Q4H PRN Hoang, Daniela B, MD   30 mL at 07/24/23 1829   haloperidol  (HALDOL ) tablet 5 mg  5 mg Oral TID PRN Hoang, Daniela B, MD       And   diphenhydrAMINE  (BENADRYL ) capsule 50 mg  50 mg Oral TID PRN Hoang, Daniela B, MD       haloperidol  lactate (HALDOL ) injection 5 mg  5 mg Intramuscular TID PRN Hoang, Daniela B, MD       And   diphenhydrAMINE  (BENADRYL ) injection 50 mg  50 mg Intramuscular TID PRN Hoang, Daniela B, MD       And   LORazepam  (  ATIVAN ) injection 2 mg  2 mg Intramuscular TID PRN Hoang, Daniela B, MD       haloperidol  lactate (HALDOL ) injection 10 mg  10 mg Intramuscular TID PRN Hoang, Daniela B, MD       And   diphenhydrAMINE  (BENADRYL ) injection 50 mg  50 mg Intramuscular TID PRN Hoang, Daniela B, MD       And   LORazepam  (ATIVAN ) injection 2 mg  2 mg Intramuscular TID PRN Hoang, Daniela B, MD       escitalopram (LEXAPRO) tablet 10 mg  10 mg Oral q AM Charlyne Robertshaw, DO   10 mg at 07/25/23 1400   hydrOXYzine  (ATARAX ) tablet 25 mg  25 mg Oral TID PRN Hoang, Daniela B, MD   25 mg at 07/24/23 2056   LORazepam  (ATIVAN ) tablet 1 mg  1 mg Oral Q6H PRN Hoang, Daniela  B, MD       LORazepam  (ATIVAN ) tablet 1 mg  1 mg Oral BID Hoang, Daniela B, MD   1 mg at 07/25/23 1204   Followed by   Cecily Cohen ON 07/26/2023] LORazepam  (ATIVAN ) tablet 1 mg  1 mg Oral Once Hoang, Daniela B, MD       magnesium  hydroxide (MILK OF MAGNESIA) suspension 30 mL  30 mL Oral Daily PRN Hoang, Daniela B, MD       multivitamin with minerals tablet 1 tablet  1 tablet Oral Daily Hoang, Daniela B, MD   1 tablet at 07/25/23 0934   naltrexone (DEPADE) tablet 25 mg  25 mg Oral QHS Wilsie Kern, DO       Followed by   Cecily Cohen ON 07/26/2023] naltrexone (DEPADE) tablet 50 mg  50 mg Oral QHS Jolana Runkles, DO       nicotine  (NICODERM CQ  - dosed in mg/24 hours) patch 21 mg  21 mg Transdermal Q0600 Hoang, Daniela B, MD   21 mg at 07/25/23 1610   ondansetron  (ZOFRAN -ODT) disintegrating tablet 4 mg  4 mg Oral Q6H PRN Hoang, Daniela B, MD       thiamine  (VITAMIN B1) tablet 100 mg  100 mg Oral Daily Hoang, Daniela B, MD   100 mg at 07/25/23 9604   traZODone  (DESYREL ) tablet 50 mg  50 mg Oral QHS PRN Hoang, Daniela B, MD   50 mg at 07/24/23 2201   No current outpatient medications on file.    Labs  Lab Results:  Admission on 07/24/2023  Component Date Value Ref Range Status   Sodium 07/25/2023 135  135 - 145 mmol/L Final   Potassium 07/25/2023 3.5  3.5 - 5.1 mmol/L Final   Chloride 07/25/2023 99  98 - 111 mmol/L Final   CO2 07/25/2023 26  22 - 32 mmol/L Final   Glucose, Bld 07/25/2023 89  70 - 99 mg/dL Final   Glucose reference range applies only to samples taken after fasting for at least 8 hours.   BUN 07/25/2023 7  6 - 20 mg/dL Final   Creatinine, Ser 07/25/2023 0.76  0.61 - 1.24 mg/dL Final   Calcium 54/10/8117 9.2  8.9 - 10.3 mg/dL Final   GFR, Estimated 07/25/2023 >60  >60 mL/min Final   Comment: (NOTE) Calculated using the CKD-EPI Creatinine Equation (2021)    Anion gap 07/25/2023 10  5 - 15 Final   Performed at [facility], [address], [place], [place] [address]  Admission on  07/23/2023, Discharged on 07/24/2023  Component Date Value Ref Range Status   WBC 07/23/2023 3.4 (  L)  4.0 - 10.5 K/uL Final   RBC 07/23/2023 4.36  4.22 - 5.81 MIL/uL Final   Hemoglobin 07/23/2023 14.9  13.0 - 17.0 g/dL Final   HCT 16/11/9602 41.8  39.0 - 52.0 % Final   MCV 07/23/2023 95.9  80.0 - 100.0 fL Final   MCH 07/23/2023 34.2 (H)  26.0 - 34.0 pg Final   MCHC 07/23/2023 35.6  30.0 - 36.0 g/dL Final   RDW 54/10/8117 14.6  11.5 - 15.5 % Final   Platelets 07/23/2023 160  150 - 400 K/uL Final   nRBC 07/23/2023 0.0  0.0 - 0.2 % Final   Neutrophils Relative % 07/23/2023 63  % Final   Neutro Abs 07/23/2023 2.1  1.7 - 7.7 K/uL Final   Lymphocytes Relative 07/23/2023 25  % Final   Lymphs Abs 07/23/2023 0.8  0.7 - 4.0 K/uL Final   Monocytes Relative 07/23/2023 9  % Final   Monocytes Absolute 07/23/2023 0.3  0.1 - 1.0 K/uL Final   Eosinophils Relative 07/23/2023 2  % Final   Eosinophils Absolute 07/23/2023 0.1  0.0 - 0.5 K/uL Final   Basophils Relative 07/23/2023 1  % Final   Basophils Absolute 07/23/2023 0.0  0.0 - 0.1 K/uL Final   Immature Granulocytes 07/23/2023 0  % Final   Abs Immature Granulocytes 07/23/2023 0.01  0.00 - 0.07 K/uL Final   Performed at [facility], [address], [place], [place] [address]   Sodium 07/23/2023 135  135 - 145 mmol/L Final   Potassium 07/23/2023 3.3 (L)  3.5 - 5.1 mmol/L Final   Chloride 07/23/2023 95 (L)  98 - 111 mmol/L Final   CO2 07/23/2023 26  22 - 32 mmol/L Final   Glucose, Bld 07/23/2023 116 (H)  70 - 99 mg/dL Final   Glucose reference range applies only to samples taken after fasting for at least 8 hours.   BUN 07/23/2023 6  6 - 20 mg/dL Final   Creatinine, Ser 07/23/2023 0.83  0.61 - 1.24 mg/dL Final   Calcium 95/62/1308 9.6  8.9 - 10.3 mg/dL Final   Total Protein 65/78/4696 6.7  6.5 - 8.1 g/dL Final   Albumin 29/52/8413 3.9  3.5 - 5.0 g/dL Final   AST 24/40/1027 57 (H)  15 - 41 U/L Final   ALT 07/23/2023 43  0 - 44 U/L Final    Alkaline Phosphatase 07/23/2023 67  38 - 126 U/L Final   Total Bilirubin 07/23/2023 2.2 (H)  0.0 - 1.2 mg/dL Final   GFR, Estimated 07/23/2023 >60  >60 mL/min Final   Comment: (NOTE) Calculated using the CKD-EPI Creatinine Equation (2021)    Anion gap 07/23/2023 14  5 - 15 Final   Performed at [facility], [address] [place], [place] [address]   Alcohol, Ethyl (B) 07/23/2023 <15  <15 mg/dL Final   Comment: (NOTE) For medical purposes only. Performed at [facility], [address] [place], [place] [address]    TSH 07/23/2023 8.361 (H)  0.350 - 4.500 uIU/mL Final   Comment: Performed by a 3rd Generation assay with a functional sensitivity of <=0.01 uIU/mL. Performed at [facility], [address], [place], [place] [address]    POC Amphetamine UR 07/23/2023 None Detected  NONE DETECTED (Cut Off Level 1000 ng/mL) Final   POC Secobarbital (BAR) 07/23/2023 None Detected  NONE DETECTED (Cut Off Level 300 ng/mL) Final   POC Buprenorphine (BUP) 07/23/2023 None Detected  NONE DETECTED (  Cut Off Level 10 ng/mL) Final   POC Oxazepam (BZO) 07/23/2023 None Detected  NONE DETECTED (Cut Off Level 300 ng/mL) Final   POC Cocaine UR 07/23/2023 None Detected  NONE DETECTED (Cut Off Level 300 ng/mL) Final   POC Methamphetamine UR 07/23/2023 None Detected  NONE DETECTED (Cut Off Level 1000 ng/mL) Final   POC Morphine 07/23/2023 None Detected  NONE DETECTED (Cut Off Level 300 ng/mL) Final   POC Methadone UR 07/23/2023 None Detected  NONE DETECTED (Cut Off Level 300 ng/mL) Final   POC Oxycodone  UR 07/23/2023 None Detected  NONE DETECTED (Cut Off Level 100 ng/mL) Final   POC Marijuana UR 07/23/2023 None Detected  NONE DETECTED (Cut Off Level 50 ng/mL) Final   Free T4 07/24/2023 0.84  0.61 - 1.12 ng/dL Final   Comment: (NOTE) Biotin ingestion may interfere with free T4 tests. If the results are inconsistent with the TSH level, previous test results, or the clinical presentation, then  consider biotin interference. If needed, order repeat testing after stopping biotin. Performed at [facility], [address], [place], [place] [address]    T3, Free 07/24/2023 3.4  2.0 - 4.4 pg/mL Final   Comment: (NOTE) Performed At: [facility] [address] [place], [place] [id number] Pearlean Botts MD QI:[id number]   Admission on 07/21/2023, Discharged on 07/21/2023  Component Date Value Ref Range Status   Prothrombin Time 07/21/2023 13.3  11.4 - 15.2 seconds Final   INR 07/21/2023 1.0  0.8 - 1.2 Final   Comment: (NOTE) INR goal varies based on device and disease states. Performed at [facility], [address], [place], [place] [address]    aPTT 07/21/2023 28  24 - 36 seconds Final   Performed at [facility], [address] [place], [place] [address]   WBC 07/21/2023 3.0 (L)  4.0 - 10.5 K/uL Final   RBC 07/21/2023 4.12 (L)  4.22 - 5.81 MIL/uL Final   Hemoglobin 07/21/2023 14.0  13.0 - 17.0 g/dL Final   HCT 53/66/4403 39.6  39.0 - 52.0 % Final   MCV 07/21/2023 96.1  80.0 - 100.0 fL Final   MCH 07/21/2023 34.0  26.0 - 34.0 pg Final   MCHC 07/21/2023 35.4  30.0 - 36.0 g/dL Final   RDW 47/42/5956 14.7  11.5 - 15.5 % Final   Platelets 07/21/2023 175  150 - 400 K/uL Final   nRBC 07/21/2023 0.0  0.0 - 0.2 % Final   Performed at [facility], [address], [place], [place] [address]   Neutrophils Relative % 07/21/2023 50  % Final   Neutro Abs 07/21/2023 1.5 (L)  1.7 - 7.7 K/uL Final   Lymphocytes Relative 07/21/2023 35  % Final   Lymphs Abs 07/21/2023 1.1  0.7 - 4.0 K/uL Final   Monocytes Relative 07/21/2023 12  % Final   Monocytes Absolute 07/21/2023 0.4  0.1 - 1.0 K/uL Final   Eosinophils Relative 07/21/2023 2  % Final   Eosinophils Absolute 07/21/2023 0.1  0.0 - 0.5 K/uL Final   Basophils Relative 07/21/2023 1  % Final   Basophils Absolute 07/21/2023 0.0  0.0 - 0.1 K/uL Final   Immature Granulocytes 07/21/2023 0  % Final   Abs  Immature Granulocytes 07/21/2023 0.01  0.00 - 0.07 K/uL Final   Performed at [facility], [address], [place], [place] [address]   Sodium 07/21/2023 139  135 - 145 mmol/L Final   Potassium 07/21/2023 3.0 (  L)  3.5 - 5.1 mmol/L Final   Chloride 07/21/2023 99  98 - 111 mmol/L Final   CO2 07/21/2023 25  22 - 32 mmol/L Final   Glucose, Bld 07/21/2023 165 (H)  70 - 99 mg/dL Final   Glucose reference range applies only to samples taken after fasting for at least 8 hours.   BUN 07/21/2023 <5 (L)  6 - 20 mg/dL Final   Creatinine, Ser 07/21/2023 0.74  0.61 - 1.24 mg/dL Final   Calcium 08/65/7846 8.9  8.9 - 10.3 mg/dL Final   Total Protein 96/29/5284 6.8  6.5 - 8.1 g/dL Final   Albumin 13/24/4010 3.8  3.5 - 5.0 g/dL Final   AST 27/25/3664 139 (H)  15 - 41 U/L Final   ALT 07/21/2023 66 (H)  0 - 44 U/L Final   Alkaline Phosphatase 07/21/2023 59  38 - 126 U/L Final   Total Bilirubin 07/21/2023 0.9  0.0 - 1.2 mg/dL Final   GFR, Estimated 07/21/2023 >60  >60 mL/min Final   Comment: (NOTE) Calculated using the CKD-EPI Creatinine Equation (2021)    Anion gap 07/21/2023 15  5 - 15 Final   Performed at [facility], [address], [place], [place] [address]   Alcohol, Ethyl (B) 07/21/2023 356 (HH)  <15 mg/dL Final   Comment: CRITICAL RESULT CALLED TO, READ BACK BY AND VERIFIED WITH C. APPLEGATE, RN AT 1750 06.03.25 D. BLU (NOTE) For medical purposes only. Performed at [facility], [address] [place], [place] [address]    Sodium 07/21/2023 139  135 - 145 mmol/L Final   Potassium 07/21/2023 3.0 (L)  3.5 - 5.1 mmol/L Final   Chloride 07/21/2023 98  98 - 111 mmol/L Final   BUN 07/21/2023 <3 (L)  6 - 20 mg/dL Final   Creatinine, Ser 07/21/2023 1.20  0.61 - 1.24 mg/dL Final   Glucose, Bld 63/87/5643 167 (H)  70 - 99 mg/dL Final   Glucose reference range applies only to samples taken after fasting for at least 8 hours.   Calcium, Ion 07/21/2023 1.02 (L)  1.15 - 1.40 mmol/L  Final   TCO2 07/21/2023 24  22 - 32 mmol/L Final   Hemoglobin 07/21/2023 14.6  13.0 - 17.0 g/dL Final   HCT 32/95/1884 43.0  39.0 - 52.0 % Final   Glucose-Capillary 07/21/2023 168 (H)  70 - 99 mg/dL Final   Glucose reference range applies only to samples taken after fasting for at least 8 hours.   Opiates 07/21/2023 NONE DETECTED  NONE DETECTED Final   Cocaine 07/21/2023 NONE DETECTED  NONE DETECTED Final   Benzodiazepines 07/21/2023 NONE DETECTED  NONE DETECTED Final   Amphetamines 07/21/2023 NONE DETECTED  NONE DETECTED Final   Tetrahydrocannabinol 07/21/2023 NONE DETECTED  NONE DETECTED Final   Barbiturates 07/21/2023 NONE DETECTED  NONE DETECTED Final   Comment: (NOTE) DRUG SCREEN FOR MEDICAL PURPOSES ONLY.  IF CONFIRMATION IS NEEDED FOR ANY PURPOSE, NOTIFY LAB WITHIN 5 DAYS.  LOWEST DETECTABLE LIMITS FOR URINE DRUG SCREEN Drug Class                     Cutoff (ng/mL) Amphetamine and metabolites    1000 Barbiturate and metabolites    200 Benzodiazepine                 200 Opiates and metabolites        300 Cocaine and metabolites        300 THC  50 Performed at [facility] [place] [facility], [address], [place], [place] [address]   Ancillary Procedure on 06/30/2023  Component Date Value Ref Range Status   Area-P 1/2 06/30/2023 3.63  cm2 Final   S' Lateral 06/30/2023 3.40  cm Final   Est EF 06/30/2023 55 - 60%   Final  Admission on 06/05/2023, Discharged on 06/09/2023  Component Date Value Ref Range Status   Lipase 06/05/2023 32  11 - 51 U/L Final   Performed at [facility], [address], [place], [place] [address]   Sodium 06/05/2023 131 (L)  135 - 145 mmol/L Final   Potassium 06/05/2023 3.4 (L)  3.5 - 5.1 mmol/L Final   Chloride 06/05/2023 92 (L)  98 - 111 mmol/L Final   CO2 06/05/2023 27  22 - 32 mmol/L Final   Glucose, Bld 06/05/2023 95  70 - 99 mg/dL Final   Glucose reference range applies only to samples taken after  fasting for at least 8 hours.   BUN 06/05/2023 11  6 - 20 mg/dL Final   Creatinine, Ser 06/05/2023 0.93  0.61 - 1.24 mg/dL Final   Calcium 21/30/8657 9.0  8.9 - 10.3 mg/dL Final   Total Protein 84/69/6295 6.3 (L)  6.5 - 8.1 g/dL Final   Albumin 28/41/3244 4.0  3.5 - 5.0 g/dL Final   AST 02/19/7251 57 (H)  15 - 41 U/L Final   ALT 06/05/2023 43  0 - 44 U/L Final   Alkaline Phosphatase 06/05/2023 55  38 - 126 U/L Final   Total Bilirubin 06/05/2023 1.6 (H)  0.0 - 1.2 mg/dL Final   GFR, Estimated 06/05/2023 >60  >60 mL/min Final   Comment: (NOTE) Calculated using the CKD-EPI Creatinine Equation (2021)    Anion gap 06/05/2023 12  5 - 15 Final   Performed at [facility], [address] [place], [place] [address]   WBC 06/05/2023 6.4  4.0 - 10.5 K/uL Final   RBC 06/05/2023 4.75  4.22 - 5.81 MIL/uL Final   Hemoglobin 06/05/2023 15.8  13.0 - 17.0 g/dL Final   HCT 34/74/2595 44.6  39.0 - 52.0 % Final   MCV 06/05/2023 93.9  80.0 - 100.0 fL Final   MCH 06/05/2023 33.3  26.0 - 34.0 pg Final   MCHC 06/05/2023 35.4  30.0 - 36.0 g/dL Final   RDW 63/87/5643 13.7  11.5 - 15.5 % Final   Platelets 06/05/2023 192  150 - 400 K/uL Final   nRBC 06/05/2023 0.0  0.0 - 0.2 % Final   Performed at [facility], [address] [place] [address] [place], [place] [address]   Color, Urine 06/05/2023 YELLOW  YELLOW Final   APPearance 06/05/2023 CLEAR  CLEAR Final   Specific Gravity, Urine 06/05/2023 >1.046 (H)  1.005 - 1.030 Final   pH 06/05/2023 7.0  5.0 - 8.0 Final   Glucose, UA 06/05/2023 NEGATIVE  NEGATIVE mg/dL Final   Hgb urine dipstick 06/05/2023 NEGATIVE  NEGATIVE Final   Bilirubin Urine 06/05/2023 SMALL (A)  NEGATIVE Final   Ketones, ur 06/05/2023 >80 (A)  NEGATIVE mg/dL Final   Protein, ur 88/41/6606 30 (A)  NEGATIVE mg/dL Final   Nitrite 30/16/0109 NEGATIVE  NEGATIVE Final   Leukocytes,Ua 06/05/2023 NEGATIVE  NEGATIVE Final   RBC / HPF 06/05/2023 0-5  0 - 5 RBC/hpf Final    WBC, UA 06/05/2023 0-5  0 - 5 WBC/hpf Final   Bacteria, UA 06/05/2023 NONE SEEN  NONE SEEN Final   Squamous Epithelial / HPF  06/05/2023 0-5  0 - 5 /HPF Final   Mucus 06/05/2023 PRESENT   Final   Performed at [facility], [address] [place], [place] [address]   Sed Rate 06/05/2023 1  0 - 16 mm/hr Final   Performed at [facility], [address] [place], [place] [address]   CRP 06/05/2023 1.6 (H)  <1.0 mg/dL Final   Performed at [facility], [address] [place], [place] [address]   Campylobacter species 06/06/2023 NOT DETECTED  NOT DETECTED Final   Plesimonas shigelloides 06/06/2023 NOT DETECTED  NOT DETECTED Final   Salmonella species 06/06/2023 NOT DETECTED  NOT DETECTED Final   Yersinia enterocolitica 06/06/2023 NOT DETECTED  NOT DETECTED Final   Vibrio species 06/06/2023 NOT DETECTED  NOT DETECTED Final   Vibrio cholerae 06/06/2023 NOT DETECTED  NOT DETECTED Final   Enteroaggregative E coli (EAEC) 06/06/2023 NOT DETECTED  NOT DETECTED Final   Enteropathogenic E coli (EPEC) 06/06/2023 NOT DETECTED  NOT DETECTED Final   Enterotoxigenic E coli (ETEC) 06/06/2023 NOT DETECTED  NOT DETECTED Final   Shiga like toxin producing E coli * 06/06/2023 NOT DETECTED  NOT DETECTED Final   Shigella/Enteroinvasive E coli (EI* 06/06/2023 NOT DETECTED  NOT DETECTED Final   Cryptosporidium 06/06/2023 NOT DETECTED  NOT DETECTED Final   Cyclospora cayetanensis 06/06/2023 NOT DETECTED  NOT DETECTED Final   Entamoeba histolytica 06/06/2023 NOT DETECTED  NOT DETECTED Final   Giardia lamblia 06/06/2023 NOT DETECTED  NOT DETECTED Final   Adenovirus F40/41 06/06/2023 NOT DETECTED  NOT DETECTED Final   Astrovirus 06/06/2023 NOT DETECTED  NOT DETECTED Final   Norovirus GI/GII 06/06/2023 NOT DETECTED  NOT DETECTED Final   Rotavirus A 06/06/2023 NOT DETECTED  NOT DETECTED Final   Sapovirus (I, II, IV, and V) 06/06/2023 NOT DETECTED  NOT DETECTED Final    Performed at [facility], [address] [place], [place] [address]   C Diff antigen 06/05/2023 NEGATIVE  NEGATIVE Final   C Diff toxin 06/05/2023 NEGATIVE  NEGATIVE Final   C Diff interpretation 06/05/2023 No C. difficile detected.   Final   Performed at [facility], [address] [place], [place] [address]   Specimen Description 06/05/2023    Final                   Value:BLOOD RIGHT HAND Performed at [facility], [address] [place], [place] [address]    Special Requests 06/05/2023    Final                   Value:Blood Culture results may not be optimal due to an inadequate volume of blood received in culture bottles BOTTLES DRAWN AEROBIC AND ANAEROBIC Performed at [facility], [address] [place], [place] [address]    Culture 06/05/2023    Final                   Value:NO GROWTH 5 DAYS Performed at [facility], [address], [place], [place] [address]    Report Status 06/05/2023 06/10/2023 FINAL   Final   Specimen Description 06/05/2023    Final                   Value:BLOOD LEFT ANTECUBITAL Performed at [facility], [address] [place], [place] [address]    Special Requests 06/05/2023    Final  Value:Blood Culture results may not be optimal due to an inadequate volume of blood received in culture bottles BOTTLES DRAWN AEROBIC AND ANAEROBIC Performed at [facility], [address], [place], [place] [address]    Culture 06/05/2023    Final                   Value:NO GROWTH 5 DAYS Performed at [facility], [address] [place], [place] [address]    Report Status 06/05/2023 06/10/2023 FINAL   Final   Calprotectin, Fecal 06/06/2023 39  0 - 120 ug/g Final   Comment: (NOTE) Concentration     Interpretation   Follow-Up < 5 - 50 ug/g     Normal           None >50 -120 ug/g     Borderline       Re-evaluate in 4-6 weeks    >120 ug/g      Abnormal         Repeat as clinically                                   indicated Performed At: [facility] [address] [place], [place] [id number] Pearlean Botts MD QM:[id number]    HIV Screen 4th Generation wRfx 06/06/2023 Non Reactive  Non Reactive Final   Performed at [facility], [address] [place], [place] [address]   WBC 06/06/2023 6.5  4.0 - 10.5 K/uL Final   RBC 06/06/2023 4.62  4.22 - 5.81 MIL/uL Final   Hemoglobin 06/06/2023 15.2  13.0 - 17.0 g/dL Final   HCT 24/40/1027 45.0  39.0 - 52.0 % Final   MCV 06/06/2023 97.4  80.0 - 100.0 fL Final   MCH 06/06/2023 32.9  26.0 - 34.0 pg Final   MCHC 06/06/2023 33.8  30.0 - 36.0 g/dL Final   RDW 25/36/6440 13.3  11.5 - 15.5 % Final   Platelets 06/06/2023 171  150 - 400 K/uL Final   nRBC 06/06/2023 0.0  0.0 - 0.2 % Final   Performed at [facility], [address], [place], [place] [address]   Sodium 06/06/2023 133 (L)  135 - 145 mmol/L Final   Potassium 06/06/2023 3.5  3.5 - 5.1 mmol/L Final   Chloride 06/06/2023 99  98 - 111 mmol/L Final   CO2 06/06/2023 23  22 - 32 mmol/L Final   Glucose, Bld 06/06/2023 90  70 - 99 mg/dL Final   Glucose reference range applies only to samples taken after fasting for at least 8 hours.   BUN 06/06/2023 8  6 - 20 mg/dL Final   Creatinine, Ser 06/06/2023 0.75  0.61 - 1.24 mg/dL Final   Calcium 59/56/3875 8.1 (L)  8.9 - 10.3 mg/dL Final   Total Protein 64/33/2951 5.7 (L)  6.5 - 8.1 g/dL Final   Albumin 88/41/6606 3.1 (L)  3.5 - 5.0 g/dL Final   AST 30/16/0109 68 (H)  15 - 41 U/L Final   ALT 06/06/2023 43  0 - 44 U/L Final   Alkaline Phosphatase 06/06/2023 46  38 - 126 U/L Final   Total Bilirubin 06/06/2023 1.4 (H)  0.0 - 1.2 mg/dL Final   GFR, Estimated 06/06/2023 >60  >60 mL/min Final   Comment: (NOTE) Calculated using the CKD-EPI Creatinine Equation (2021)    Anion gap 06/06/2023 11  5 - 15 Final   Performed at  [facility], [address], [place], [place] [address]   Prothrombin Time 06/06/2023 14.0  11.4 - 15.2 seconds Final   INR 06/06/2023 1.1  0.8 - 1.2 Final   Comment: (NOTE) INR goal varies based on device and disease states. Performed at [facility], [address] [place], [place] [id number]    CRP 06/07/2023 4.2 (H)  <1.0 mg/dL Final   Performed at [facility], [address] [place], [place] [address]   WBC 06/08/2023 6.8  4.0 - 10.5 K/uL Final   RBC 06/08/2023 4.65  4.22 - 5.81 MIL/uL Final   Hemoglobin 06/08/2023 15.7  13.0 - 17.0 g/dL Final   HCT 46/96/2952 45.1  39.0 - 52.0 % Final   MCV 06/08/2023 97.0  80.0 - 100.0 fL Final   MCH 06/08/2023 33.8  26.0 - 34.0 pg Final   MCHC 06/08/2023 34.8  30.0 - 36.0 g/dL Final   RDW 84/13/2440 13.3  11.5 - 15.5 % Final   Platelets 06/08/2023 187  150 - 400 K/uL Final   nRBC 06/08/2023 0.0  0.0 - 0.2 % Final   Performed at [facility], [address], [place], [place] [address]   Sodium 06/08/2023 132 (L)  135 - 145 mmol/L Final   Potassium 06/08/2023 4.0  3.5 - 5.1 mmol/L Final   Chloride 06/08/2023 97 (L)  98 - 111 mmol/L Final   CO2 06/08/2023 23  22 - 32 mmol/L Final   Glucose, Bld 06/08/2023 94  70 - 99 mg/dL Final   Glucose reference range applies only to samples taken after fasting for at least 8 hours.   BUN 06/08/2023 <5 (L)  6 - 20 mg/dL Final   Creatinine, Ser 06/08/2023 0.87  0.61 - 1.24 mg/dL Final   Calcium 53/66/4403 8.2 (L)  8.9 - 10.3 mg/dL Final   Total Protein 47/42/5956 5.7 (L)  6.5 - 8.1 g/dL Final   Albumin 38/75/6433 3.1 (L)  3.5 - 5.0 g/dL Final   AST 29/51/8841 29  15 - 41 U/L Final   ALT 06/08/2023 28  0 - 44 U/L Final   Alkaline Phosphatase 06/08/2023 51  38 - 126 U/L Final   Total Bilirubin 06/08/2023 1.3 (H)  0.0 - 1.2 mg/dL Final   GFR, Estimated 06/08/2023 >60  >60 mL/min Final   Comment: (NOTE) Calculated using the CKD-EPI Creatinine Equation (2021)    Anion gap 06/08/2023  12  5 - 15 Final   Performed at [facility], [address] [place], [place] [address]   SURGICAL PATHOLOGY 06/08/2023    Final-Edited                   Value:SURGICAL PATHOLOGY CASE: [id number] PATIENT: Vickii Grand Surgical Pathology Report     Clinical History: Abdominal pain, diarrhea, abnormal bowel imaging on CT scan, R/O Crohn's disease (las)     FINAL MICROSCOPIC DIAGNOSIS:  A. COLON, TERMINAL ILIUM, BIOPSY: - Ileal mucosa with no specific histopathologic changes - Negative for acute inflammation, features of chronicity or granulomas  B. COLON, ASCENDING, BIOPSY: - Colonic mucosa with no specific histopathologic changes - Negative for acute inflammation, features of chronicity, granulomas or dysplasia  C. COLON, TRANSVERSE, BIOPSY: - Colonic mucosa with no specific histopathologic changes - Negative for acute inflammation, features of chronicity, granulomas or dysplasia  D. COLON, DESCENDING, BIOPSY: - Colonic mucosa with no specific histopathologic changes - Negative for acute inflammation, features of chronicity, granulomas  or dysplasia  E. COLON, RECTO-SIGMOID, BIOPSY: - Focal low-grade ade                         nomatous dysplasia, consistent with an incidental diminutive tubular adenoma - Background colonic mucosa with mild nonspecific hyperplastic changes - Negative for acute inflammation, features of chronicity or granulomas    GROSS DESCRIPTION:  A: Received in formalin are tan, soft tissue fragments that are submitted in toto. Number: 6 size: 0.2-0.5 cm blocks: 1  B: Received in formalin are tan, soft tissue fragments that are submitted in toto. Number: 5 size: 0.2-0.6 cm blocks: 1  C: Received in formalin are tan, soft tissue fragments that are submitted in toto. Number: 6 size: 0.2-0.6 cm blocks: 1  D: Received in formalin are tan, soft tissue fragments that are submitted in toto. Number: 5 size: 0.2-0.6 cm  blocks: 1  E: Received in formalin are tan, soft tissue fragments that are submitted in toto. Number: 6 size: 0.2-0.5 cm blocks: 1 (GRP 06/09/2023)   Final Diagnosis performed by Lee Public, MD.   Electronically signed 06/10/2023 Technical and / or Profe                         ssional components performed at [facility], [address] [place], [place] [address].  Immunohistochemistry Technical component (if applicable) was performed at [facility]. [address], [address], [place], [place] [address].   IMMUNOHISTOCHEMISTRY DISCLAIMER (if applicable): Some of these immunohistochemical stains may have been developed and the performance characteristics determine by [facility]. Some may not have been cleared or approved by the U.S. Food and Drug Administration. The FDA has determined that such clearance or approval is not necessary. This test is used for clinical purposes. It should not be regarded as investigational or for research. This laboratory is certified under the Clinical Laboratory Improvement Amendments of 1988 (CLIA-88) as qualified to perform high complexity clinical laboratory testing.  The controls stained appropriately.   IHC stains are performed on formalin fixed, paraffin embe                         dded tissue using a 3,3"diaminobenzidine (DAB) chromogen and Leica Bond Autostainer System. The staining intensity of the nucleus is score manually and is reported as the percentage of tumor cell nuclei demonstrating specific nuclear staining. The specimens are fixed in 10% Neutral Formalin for at least 6 hours and up to 72hrs. These tests are validated on decalcified tissue. Results should be interpreted with caution given the possibility of false negative results on decalcified specimens. Antibody Clones are as follows ER-clone 23F, PR-clone 16, Ki67- clone MM1. Some of these immunohistochemical stains may have  been developed and the performance characteristics determined by [facility].   Appointment on 06/03/2023  Component Date Value Ref Range Status   Iron 06/03/2023 159  42 - 165 ug/dL Final   Transferrin 09/81/1914 265.0  212.0 - 360.0 mg/dL Final   Saturation Ratios 06/03/2023 42.9  20.0 - 50.0 % Final   Ferritin 06/03/2023 199.7  22.0 - 322.0 ng/mL Final   TIBC 06/03/2023 371.0  250.0 - 450.0 mcg/dL Final   Sed Rate 78/29/5621 5  0 - 15 mm/hr Final   CRP 06/03/2023 1.5  0.5 - 20.0 mg/dL Final   Magnesium  06/03/2023 1.6  1.5 - 2.5 mg/dL Final   Sodium  06/03/2023 137  135 - 145 mEq/L Final   Potassium 06/03/2023 3.9  3.5 - 5.1 mEq/L Final   Chloride 06/03/2023 93 (L)  96 - 112 mEq/L Final   CO2 06/03/2023 32  19 - 32 mEq/L Final   Glucose, Bld 06/03/2023 107 (H)  70 - 99 mg/dL Final   BUN 40/98/1191 9  6 - 23 mg/dL Final   Creatinine, Ser 06/03/2023 1.01  0.40 - 1.50 mg/dL Final   Total Bilirubin 06/03/2023 1.3 (H)  0.2 - 1.2 mg/dL Final   Alkaline Phosphatase 06/03/2023 69  39 - 117 U/L Final   AST 06/03/2023 128 (H)  0 - 37 U/L Final   ALT 06/03/2023 64 (H)  0 - 53 U/L Final   Total Protein 06/03/2023 7.4  6.0 - 8.3 g/dL Final   Albumin 47/82/9562 4.6  3.5 - 5.2 g/dL Final   GFR 13/09/6576 87.46  >60.00 mL/min Final   Calculated using the CKD-EPI Creatinine Equation (2021)   Calcium 06/03/2023 9.8  8.4 - 10.5 mg/dL Final   WBC 46/96/2952 7.2  4.0 - 10.5 K/uL Final   RBC 06/03/2023 5.21  4.22 - 5.81 Mil/uL Final   Hemoglobin 06/03/2023 17.2 (H)  13.0 - 17.0 g/dL Final   HCT 84/13/2440 51.5  39.0 - 52.0 % Final   MCV 06/03/2023 98.8  78.0 - 100.0 fl Final   MCHC 06/03/2023 33.3  30.0 - 36.0 g/dL Final   RDW 12/14/2534 15.8 (H)  11.5 - 15.5 % Final   Platelets 06/03/2023 245.0  150.0 - 400.0 K/uL Final   Neutrophils Relative % 06/03/2023 80.4 (H)  43.0 - 77.0 % Final   Lymphocytes Relative 06/03/2023 9.4 (L)  12.0 - 46.0 % Final   Monocytes Relative 06/03/2023 9.4  3.0 -  12.0 % Final   Eosinophils Relative 06/03/2023 0.1  0.0 - 5.0 % Final   Basophils Relative 06/03/2023 0.7  0.0 - 3.0 % Final   Neutro Abs 06/03/2023 5.8  1.4 - 7.7 K/uL Final   Lymphs Abs 06/03/2023 0.7  0.7 - 4.0 K/uL Final   Monocytes Absolute 06/03/2023 0.7  0.1 - 1.0 K/uL Final   Eosinophils Absolute 06/03/2023 0.0  0.0 - 0.7 K/uL Final   Basophils Absolute 06/03/2023 0.0  0.0 - 0.1 K/uL Final  Admission on 06/01/2023, Discharged on 06/02/2023  Component Date Value Ref Range Status   Lipase 06/01/2023 37  11 - 51 U/L Final   Performed at [facility], [address], [place], [place] [address]   Sodium 06/01/2023 138  135 - 145 mmol/L Final   Potassium 06/01/2023 4.5  3.5 - 5.1 mmol/L Final   Chloride 06/01/2023 97 (L)  98 - 111 mmol/L Final   CO2 06/01/2023 25  22 - 32 mmol/L Final   Glucose, Bld 06/01/2023 122 (H)  70 - 99 mg/dL Final   Glucose reference range applies only to samples taken after fasting for at least 8 hours.   BUN 06/01/2023 8  6 - 20 mg/dL Final   Creatinine, Ser 06/01/2023 0.93  0.61 - 1.24 mg/dL Final   Calcium 47/42/5956 9.7  8.9 - 10.3 mg/dL Final   Total Protein 38/75/6433 7.6  6.5 - 8.1 g/dL Final   Albumin 29/51/8841 4.1  3.5 - 5.0 g/dL Final   AST 66/07/3014 98 (H)  15 - 41 U/L Final   ALT 06/01/2023 77 (H)  0 - 44 U/L Final   Alkaline Phosphatase 06/01/2023 80  38 - 126 U/L Final  Total Bilirubin 06/01/2023 1.5 (H)  0.0 - 1.2 mg/dL Final   GFR, Estimated 06/01/2023 >60  >60 mL/min Final   Comment: (NOTE) Calculated using the CKD-EPI Creatinine Equation (2021)    Anion gap 06/01/2023 16 (H)  5 - 15 Final   Performed at [facility], [address] [place], [place] [address]   WBC 06/01/2023 8.3  4.0 - 10.5 K/uL Final   RBC 06/01/2023 5.30  4.22 - 5.81 MIL/uL Final   Hemoglobin 06/01/2023 17.4 (H)  13.0 - 17.0 g/dL Final   HCT 60/45/4098 51.1  39.0 - 52.0 % Final   MCV 06/01/2023 96.4  80.0 - 100.0 fL Final   MCH 06/01/2023 32.8  26.0 - 34.0 pg  Final   MCHC 06/01/2023 34.1  30.0 - 36.0 g/dL Final   RDW 11/91/4782 14.6  11.5 - 15.5 % Final   Platelets 06/01/2023 254  150 - 400 K/uL Final   nRBC 06/01/2023 0.0  0.0 - 0.2 % Final   Performed at [facility], [address], [place], [place] [address]   Color, Urine 06/01/2023 AMBER (A)  YELLOW Final   BIOCHEMICALS MAY BE AFFECTED BY COLOR   APPearance 06/01/2023 CLEAR  CLEAR Final   Specific Gravity, Urine 06/01/2023 1.027  1.005 - 1.030 Final   pH 06/01/2023 5.0  5.0 - 8.0 Final   Glucose, UA 06/01/2023 NEGATIVE  NEGATIVE mg/dL Final   Hgb urine dipstick 06/01/2023 NEGATIVE  NEGATIVE Final   Bilirubin Urine 06/01/2023 NEGATIVE  NEGATIVE Final   Ketones, ur 06/01/2023 20 (A)  NEGATIVE mg/dL Final   Protein, ur 30/86/5784 100 (A)  NEGATIVE mg/dL Final   Nitrite 69/62/9528 NEGATIVE  NEGATIVE Final   Leukocytes,Ua 06/01/2023 NEGATIVE  NEGATIVE Final   RBC / HPF 06/01/2023 0-5  0 - 5 RBC/hpf Final   WBC, UA 06/01/2023 0-5  0 - 5 WBC/hpf Final   Bacteria, UA 06/01/2023 NONE SEEN  NONE SEEN Final   Squamous Epithelial / HPF 06/01/2023 0-5  0 - 5 /HPF Final   Mucus 06/01/2023 PRESENT   Final   Performed at [facility], [address], [place], [place] [address]  Admission on 06/01/2023, Discharged on 06/01/2023  Component Date Value Ref Range Status   POCT Glucose ([facility]) 06/01/2023 135 (A)  70 - 99 mg/dL Final  Procedure visit on 05/20/2023  Component Date Value Ref Range Status   SURGICAL PATHOLOGY 05/20/2023    Final-Edited                   Value:SURGICAL PATHOLOGY [facility] [address] [place], [place] [address] Telephone [phone] or [phone] Fax [phone]  REPORT OF SURGICAL PATHOLOGY   Accession #: [id number] Patient Name: JAZPER, NIKOLAI Visit # : [id number]  MRN: [id number] Physician: Harry Lindau DOB/Age Jun 28, 1975 (Age: 74) Gender: M Collected Date: 05/20/2023 Received Date: 05/21/2023  FINAL DIAGNOSIS        1. Surgical [P], gastric random :       REACTIVE GASTROPATHY      NEGATIVE FOR H. PYLORI, INTESTINAL METAPLASIA, DYSPLASIA AND CARCINOMA       2. Surgical [P], distal esophagus :       REACTIVE SQUAMOUS MUCOSA WITH CHANGES SUGGESTIVE OF REFLUX ESOPHAGITIS      NEGATIVE FOR GLANDULAR EPITHELIUM, EOSINOPHILS, DYSPLASIA AND CARCINOMA       ELECTRONIC SIGNATURE : Picklesimer Md, Fred , Sports administrator, International aid/development worker  MICROSCOPIC DESCRIPTION  CASE COMMENTS STAINS USED IN DIAGNOSIS: H&E  H&E    CLINICAL HISTORY  SPECIMEN(S) OB                         TAINED 1. Surgical [P], Gastric Random 2. Surgical [P], Distal Esophagus  SPECIMEN COMMENTS: 1. Nausea; esophagitis; gastritis without bleeding, unspecified chronicity, unspecified gastritis type SPECIMEN CLINICAL INFORMATION: 1. R/O H.pylori 2. R/O barrett's esophagus    Gross Description 1. Received in formalin are tan, soft tissue fragments that are submitted in toto.Number: 2, Size: 0.3 cm smallest to 0.5 cm largest, (1B) ( TA ) 2. Received in formalin are tan, soft tissue fragments that are submitted in toto.Number: 2, Size: 0.3 cm smallest to 0.4 cm largest, (1B) ( TA )        Report signed out from the following location(s) [facility] [address] [place], [place] [address] CLIA #: [id number]  [facility] [address] [place], [place] [address] CLIA #: [id number]   Appointment on 05/01/2023  Component Date Value Ref Range Status   Hep B S Ab 05/01/2023 NON-REACTIVE  NON-REACTIVE Final   Hepatitis A AB,Total 05/01/2023 NON-REACTIVE  NON-REACTIVE Final   Comment: . For additional information, please refer to  http://education.[facility].com/faq/FAQ202  (This link is being provided for informational/ educational purposes only.) .    Hepatitis B Surface Ag 05/01/2023 NON-REACTIVE  NON-REACTIVE Final   Comment: . For additional information, please refer to   http://education.[facility].com/faq/FAQ202  (This link is being provided for informational/ educational purposes only.) .    Hepatitis C Ab 05/01/2023 NON-REACTIVE  NON-REACTIVE Final   Comment: . HCV antibody was non-reactive. There is no laboratory  evidence of HCV infection. . In most cases, no further action is required. However, if recent HCV exposure is suspected, a test for HCV RNA (test code 44034) is suggested. . For additional information please refer to http://education.quest[facility].com/faq/FAQ22v1 (This link is being provided for informational/ educational purposes only.) .    Ceruloplasmin 05/01/2023 27  14 - 30 mg/dL Final   Actin (Smooth Muscle) Antibody (IG* 05/01/2023 <20  <20 U Final   Comment: . Reference Range:    <20 U: Negative >or=20 U: Positive . Aaron Aas Antibodies recognizing actin are the main component of smooth muscle antibodies associated with auto- immune liver disease. Actin antibodies are found in approximately 75% of patients with autoimmune hepatitis (AIH) type 1, approximately 65% of patients with autoimmune cholangitis, approximately 30% of patients with primary biliary cirrhosis and approximately 2% of healthy controls. High values are closely correlated with AIH type 1. .    Anti Nuclear Antibody (ANA) 05/01/2023 NEGATIVE  NEGATIVE Final   Comment: ANA IFA is a first line screen for detecting the presence of up to approximately 150 autoantibodies in various autoimmune diseases. A negative ANA IFA result suggests an ANA-associated autoimmune disease is not present at this time, but is not definitive. If there is high clinical suspicion for Sjogren's syndrome, testing for anti-SS-A/Ro antibody should be considered. Anti-Jo-1 antibody should be considered for clinically suspected inflammatory myopathies. . AC-0: Negative . International Consensus on ANA Patterns (SeverTies.uy) . For additional  information, please refer to http://education.[facility].com/faq/FAQ177 (This link is being provided for informational/ educational purposes only.) .    Mitochondrial M2 Ab, IgG 05/01/2023 <=20.0  <=20.0 U Final   Comment: . Reference Range:    Negative:  <=20.0      U    Equivocal: 20.1 - 24.9 U    Positive:  >=25.0  U .    A-1 Antitrypsin, Ser 05/01/2023 184  83 - 199 mg/dL Final   IgG (Immunoglobin G), Serum 05/01/2023 1,060  600 - 1,640 mg/dL Final   INR 60/45/4098 1.1 (H)  0.8 - 1.0 ratio Final   Prothrombin Time 05/01/2023 11.7  9.6 - 13.1 sec Final   IgM, Serum 05/01/2023 95  50 - 300 mg/dL Final   (tTG) Ab, IgA 11/91/4782 <1.0  U/mL Final   Comment: Value          Interpretation -----          -------------- <15.0          Antibody not detected > or = 15.0    Antibody detected .    Immunoglobulin A 05/01/2023 279  47 - 310 mg/dL Final   Total Bilirubin 05/01/2023 1.2  0.2 - 1.2 mg/dL Final   Bilirubin, Direct 05/01/2023 0.4 (H)  0.0 - 0.3 mg/dL Final   Alkaline Phosphatase 05/01/2023 65  39 - 117 U/L Final   AST 05/01/2023 181 (H)  0 - 37 U/L Final   ALT 05/01/2023 78 (H)  0 - 53 U/L Final   Total Protein 05/01/2023 7.9  6.0 - 8.3 g/dL Final   Albumin 95/62/1308 4.9  3.5 - 5.2 g/dL Final  There may be more visits with results that are not included.    Blood Alcohol level:  Lab Results  Component Value Date   ETH <15 07/23/2023   ETH 356 (HH) 07/21/2023    Metabolic Disorder Labs: No results found for: "HGBA1C", "MPG" No results found for: "PROLACTIN" Lab Results  Component Value Date   CHOL 254 (H) 03/22/2019   TRIG 72.0 03/22/2019   HDL 72.40 03/22/2019   CHOLHDL 4 03/22/2019   VLDL 14.4 03/22/2019   LDLCALC 167 (H) 03/22/2019    Therapeutic Lab Levels: No results found for: "LITHIUM" No results found for: "VALPROATE" No results found for: "CBMZ"  Physical Findings   GAD-7    Flowsheet Row Office Visit [facility] 03/22/2019 [facility]  Total GAD-7 Score 2      PHQ2-9    Flowsheet Row Office Visit from 03/22/2019 in [facility]  PHQ-2 Total Score 0  PHQ-9 Total Score 0      Flowsheet Row ED from 07/24/2023 in [facility] ED from 07/23/2023 in [facility] ED from 07/21/2023 in [facility] Emergency Department at [facility]  C-SSRS RISK CATEGORY No Risk No Risk No Risk        Musculoskeletal  Strength & Muscle Tone: within normal limits Gait & Station: normal Patient leans: N/A  Psychiatric Specialty Exam  Presentation  General Appearance:  Appropriate for Environment; Casual; Fairly Groomed  Eye Contact: Fair  Speech: Clear and Coherent; Normal Rate  Speech Volume: Normal  Handedness: Right   Mood and Affect  Mood: Anxious  Affect: Appropriate; Congruent; Restricted   Thought Process  Thought Processes: Coherent; Goal Directed; Linear  Descriptions of Associations:Intact  Orientation:Full (Time, Place and Person)  Thought Content:Rumination; WDL  Diagnosis of Schizophrenia or Schizoaffective disorder in past: No    Hallucinations:Hallucinations: None  Ideas of Reference:None  Suicidal Thoughts:Suicidal Thoughts: No  Homicidal Thoughts:Homicidal Thoughts: No   Sensorium  Memory: Immediate Good  Judgment: Fair  Insight: Shallow   Executive Functions  Concentration: Good  Attention Span: Good  Recall: Good  Fund of Knowledge: Good  Language:  Good   Psychomotor Activity  Psychomotor Activity: Psychomotor Activity: Normal   Assets  Assets: Communication Skills; Desire for Improvement; Resilience   Sleep  Sleep: Sleep: Fair   Physical Exam  Physical Exam Vitals and nursing note reviewed.  Constitutional:      General: He is not in acute distress.    Appearance: Normal appearance. He is not  ill-appearing, toxic-appearing or diaphoretic.  HENT:     Head: Normocephalic and atraumatic.  Eyes:     Conjunctiva/sclera: Conjunctivae normal.  Pulmonary:     Effort: Pulmonary effort is normal. No respiratory distress.  Neurological:     General: No focal deficit present.     Mental Status: He is alert and oriented to person, place, and time.    Review of Systems  Constitutional:  Negative for diaphoresis and malaise/fatigue.  Respiratory:  Negative for shortness of breath.   Cardiovascular:  Negative for chest pain.  Gastrointestinal:  Negative for nausea and vomiting.  Neurological:  Positive for tremors. Negative for dizziness and headaches.   Blood pressure (!) 124/90, pulse 96, temperature 98.8 F (37.1 C), temperature source Oral, resp. rate 19, SpO2 98%. There is no height or weight on file to calculate BMI.  Treatment Plan Summary: Daily contact with patient to assess and evaluate symptoms and progress in treatment and Medication management  Dx: AUD (no sz or Dts), nicotine  use d/o, GAD  Started lexapro 10 mg daily (new) Started naltrexone 25 mg x1d then increase to 50 mg qPM (new) Cont PRN vistaril  25 mg TID for anxiety Cont ativan  taper (ends 6/8) Cont B1, MV per protocol Cont nic patch 21 mg daily  Dispo: home 07/26/2023  Ori Trejos, DO 07/25/2023 2:30 PM

## 2023-07-25 NOTE — ED Notes (Signed)
 Patient awake and alert on unit without distress.  Ciwa is 4 due to anxiety and slight tremors.  Patient is calm and pleasant.  Makes needs known.  Will monitor.

## 2023-07-25 NOTE — ED Notes (Signed)
 Patient is sleeping. Respirations equal and unlabored, skin warm and dry. No change in assessment or acuity. Routine safety checks conducted according to facility protocol.

## 2023-07-25 NOTE — Group Note (Signed)
 Group Topic: Fears and Unhealthy Coping Skills  Group Date: 07/25/2023 Start Time: 1030 End Time: 1100 Facilitators: Milan Alfred, NT MHT 2 Department: [facility]  Number of Participants: 1  Group Focus: coping skills Treatment Modality:  Behavior Modification Therapy Interventions utilized were patient education Purpose: explore maladaptive thinking  Name: Jordan Smith Date of Birth: 09-30-1975  MR: [id number]    Level of Participation: Patient did not attend group Quality of Participation: N/A Interactions with others: N/A Mood/Affect: N/A Triggers (if applicable): N/A Cognition: N/A Progress: N/A Response: N/A Plan: N/A  Patients Problems:  Patient Active Problem List   Diagnosis Date Noted   Alcohol use disorder 07/24/2023   Abnormal CT scan, gastrointestinal tract 06/07/2023   Bilious vomiting with nausea 06/07/2023   Unintentional weight loss 06/07/2023   Enterocolitis 06/05/2023   Hypokalemia 06/05/2023   Hyponatremia 06/05/2023   Class 1 obesity 06/05/2023   Low testosterone 02/03/2023   Rosacea 02/03/2023   Heart palpitations 02/03/2023   Hepatic steatosis 04/13/2019   Mild intermittent asthma without complication 04/13/2019   Class 2 obesity due to excess calories with body mass index (BMI) of 35.0 to 35.9 in adult 04/13/2019   Unilateral inguinal hernia without obstruction or gangrene 03/22/2019   Reactive airway disease 03/22/2019   Erectile dysfunction due to arterial insufficiency 03/22/2019   Healthcare maintenance 03/22/2019   Elevated LFTs 03/22/2019   Close exposure to COVID-19 virus 03/22/2019   Elevated LDL cholesterol level 03/22/2019

## 2023-07-25 NOTE — ED Notes (Signed)
 Patient resting quietly in bed with eyes closed with unlabored breathing. Q 15 minute safety checks remain in place.  Pt remains safe on the unit at this time.

## 2023-07-25 NOTE — ED Notes (Signed)
 Patient awake and alert most of the day.  He interacts with staff and has been watching tv with peers most of the afternoon.  He attended group held outside after lunch.  He has limited understanding of alcohol problem and what he needs to do to address it.  Education provided.  Patients ciwa is a 3 for mild tremors and anxiety.  Will monitor.

## 2023-07-25 NOTE — ED Notes (Signed)
 BMP lab collected at 0511 and sent to [facility] lab via DASH courier.

## 2023-07-26 MED ORDER — NALTREXONE HCL 50 MG PO TABS
50.0000 mg | ORAL_TABLET | Freq: Every day | ORAL | Status: DC
  Administered 2023-07-26: 50 mg via ORAL
  Filled 2023-07-26: qty 1

## 2023-07-26 MED ORDER — NALTREXONE HCL 50 MG PO TABS: 50.0000 mg | ORAL_TABLET | Freq: Every day | ORAL | 0 refills | Status: AC

## 2023-07-26 MED ORDER — ESCITALOPRAM OXALATE 10 MG PO TABS: 10.0000 mg | ORAL_TABLET | Freq: Every day | ORAL | 0 refills | Status: AC

## 2023-07-26 MED ORDER — VITAMIN B-1 100 MG PO TABS: 100.0000 mg | ORAL_TABLET | Freq: Every day | ORAL | 0 refills | Status: AC

## 2023-07-26 MED ORDER — ADULT MULTIVITAMIN W/MINERALS CH
1.0000 | ORAL_TABLET | Freq: Every day | ORAL | 0 refills | Status: AC
Start: 2023-07-26 — End: 2023-08-25

## 2023-07-26 MED ORDER — TRAZODONE HCL 50 MG PO TABS: 25.0000 mg | ORAL_TABLET | Freq: Every evening | ORAL | 0 refills | Status: AC | PRN

## 2023-07-26 MED ORDER — NICOTINE 21 MG/24HR TD PT24: 21.0000 mg | MEDICATED_PATCH | Freq: Every morning | TRANSDERMAL | 0 refills | Status: AC

## 2023-07-26 MED ORDER — HYDROXYZINE HCL 25 MG PO TABS: 25.0000 mg | ORAL_TABLET | Freq: Three times a day (TID) | ORAL | 0 refills | Status: AC | PRN

## 2023-07-26 NOTE — ED Notes (Signed)
 Patient A&O x 4, calm and cooperative. Patient endorsed feeling blissful and joyful, denies SI, HI, AVH. Patient then stated he had a conversation with his wife that caused him to become anxious. Patient expressed anxiousness a 5/10. Supportive listening/medication and encouragement provided. Patient to d/c today by noon.

## 2023-07-26 NOTE — Discharge Instructions (Addendum)
 FREE nicotine  patches/gum: You can call 1-800-QUIT-NOW (715-843-8244) or text "QUITNOW" to [phone]) 223-6914 to access "quitlines" in your state. A quitline is a telephone hotline that connects you to a "quit coach" and other resources for quitting smoking, including support groups and access to NRT (nicotine  replacement therapy).  Non-Emergent / Urgent  [facility] [address], SECOND FLOOR [place], [place] [address] [phone] OUTPATIENT Walk-in information: Please note, all walk-ins are first come & first serve, with limited number of availability.  Please note that to be eligible for services you must bring: ID or a piece of mail with your name [facility] address  Therapist for therapy:  Monday & Wednesdays: Please ARRIVE at 7:15 AM for registration Will START at 8:00 AM Every 1st & 2nd Friday of the month: Please ARRIVE at 10:15 AM for registration Will START at 1 PM - 5 PM  Psychiatrist for medication management: Monday - Friday:  Please ARRIVE at 7:15 AM for registration Will START at 8:00 AM  Regretfully, due to limited availability, please be aware that you may not been seen on the same day as walk-in. Please consider making an appoint or try again. Thank you for your patience and understanding. ________________________________________________________  [facility] URGENT CARE:  [address] FIRST FLOOR.  [place], [place] [address].  [phone]  Mobile Crisis Response Teams Listed by counties in vicinity of [facility] providers [facility] [phone] [facility] [phone] [facility] [phone] [facility] [phone] [place]                * [facility] [phone]                * [facility] [phone] [facility] [phone] [facility]  [phone] * [facility] [phone] ________________________________________________________  To see which pharmacy near you is the CHEAPEST for certain medications, please use GoodRx. It is free website and has a free phone app.    Also consider looking at [facility] $4.00 or [facility]'s $7.00 prescription list. Both are free to view if googled "[facility] $4 prescription" and "public's $7 prescription". These are set prices, no insurance required. [facility] low cost medications: $4-$15 for 30days prescriptions or $10-$38 for 90days prescriptions  ________________________________________________________  Difficulties with sleep?

## 2023-07-26 NOTE — ED Provider Notes (Signed)
 FBC/OBS ASAP Discharge Summary  Date and Time: 07/26/2023 9:54 AM  Name: Jordan Smith  MRN:  [id number]   Discharge Diagnoses:  Final diagnoses:  Alcohol use disorder, severe, dependence (HCC)  GAD (generalized anxiety disorder)  Nicotine  use disorder   Jordan Smith is a 48 y.o. male with PMH of AUD (no sz or DTs), nicotine  use d/o, who arrived to [facility] (07/21/2023) via EMS as a code stroke, but workup found negative, then presented to [facility] (07/23/2023) with wife to be admitted to [facility] the same day for etoh detox.  This is his first time detox, has never been to residential or received substance use treatment in the past.  PTA rx: NA  Stay Summary:  Admission date: 07/24/2023 Discharge date: 07/26/2023   Total duration of encounter: 2 days  Precautions: CIWA  Behavior: No acute concerns  # AUD, severe, w dependence (no sz or Dts)  Started on ativan  taper, no significant withdrawal sxs aside from anxiety. Tolerated naltrexone well, no side effects thus far. Did have protracted withdrawal insomnia that improved some during his stay, PRN trazodone  helpful.  Started naltrexone 25 mg then increased to 50 mg qPM Started thiamine  100 mg and MV daily per protocol Started trazodone  25-50 mg PRN qPM Ativan  taper - completed   # Nicotine  use d/o (vapes) Talked about chantix vs wellbutrin, but instructed him to talk to PCP given starting multiple meds here. NRTs  # GAD - new Excessive worry about "everything" with intermittent panic attacks, that proceeded the drinking, consistent with GAD. New dx. SSRI naive. Started him on lexapro, no side effects thus far. Used PRN vistaril  here for panic attack to good effect, so sending him home with some. During this time here ruminated on his job, worried that he will lose it because they believe that he had a stroke and not etoh intoxicated.  Started lexapro 10 mg qPM Started vistaril  25-50 mg PRN TID  CMP - total bili 2.2, AST/ALT elevated 2:1  ratio, down trended on repeat ALT wnl, AST 57.  CBC w diff - WBC 3/4 TSH ~8, but FT4 was WNL. UDS negative EKG NSR    Subjective:   Patient was seen in room, sitting on bed, no acute distress, pleasant and engaged with eval  Denied side effects to lexapro or naltrexone. Denied cravings at this time.  Mood: "nervous" - job thinks he had a stroke and wants to keep it that way. They do not know that he was drinking.  - going home to wife to sort things at home - talking to boss monday and about time off for treatment - talked about AA and 12 steps - no panic attacks  Sleep: ok, middle insomnia, tired some, but doesn't need to nap  Appetite: much improved, increased energy because of it.   Denied active and passive SI, HI, AVH, paranoia  Aware of [facility], 988, 911 Denied access to guns for weapons.   Review of Systems  Constitutional:  Negative for malaise/fatigue.  HENT:  Negative for congestion.   Respiratory:  Negative for shortness of breath.   Cardiovascular:  Negative for chest pain.  Gastrointestinal:  Negative for nausea and vomiting.  Neurological:  Positive for headaches (wearing old glasses). Negative for dizziness, tremors and weakness.   Total Time spent with patient: 1 hour  Past Psychiatric History:  Dx: AUD, nicotine  use d/o Denies current or past psychotropic medications Therapy: better health No prior psychiatric hospitalizations   Past Medical History: HLD,  HTN, OSA   Family History: denies psychiatric history   Social History: lives with wife and daughter 50 years old), currently a Production designer, theatre/television/film at [employer]  Tobacco Cessation:  A prescription for an FDA-approved tobacco cessation medication provided at discharge  Current Medications:  Current Facility-Administered Medications  Medication Dose Route Frequency Provider Last Rate Last Admin   acetaminophen  (TYLENOL ) tablet 650 mg  650 mg Oral Q6H PRN Hoang, Daniela B, MD       alum & mag  hydroxide-simeth (MAALOX/MYLANTA) 200-200-20 MG/5ML suspension 30 mL  30 mL Oral Q4H PRN Hoang, Daniela B, MD   30 mL at 07/24/23 1829   haloperidol  (HALDOL ) tablet 5 mg  5 mg Oral TID PRN Hoang, Daniela B, MD       And   diphenhydrAMINE  (BENADRYL ) capsule 50 mg  50 mg Oral TID PRN Hoang, Daniela B, MD       haloperidol  lactate (HALDOL ) injection 5 mg  5 mg Intramuscular TID PRN Hoang, Daniela B, MD       And   diphenhydrAMINE  (BENADRYL ) injection 50 mg  50 mg Intramuscular TID PRN Hoang, Daniela B, MD       And   LORazepam  (ATIVAN ) injection 2 mg  2 mg Intramuscular TID PRN Hoang, Daniela B, MD       haloperidol  lactate (HALDOL ) injection 10 mg  10 mg Intramuscular TID PRN Hoang, Daniela B, MD       And   diphenhydrAMINE  (BENADRYL ) injection 50 mg  50 mg Intramuscular TID PRN Hoang, Daniela B, MD       And   LORazepam  (ATIVAN ) injection 2 mg  2 mg Intramuscular TID PRN Hoang, Daniela B, MD       escitalopram (LEXAPRO) tablet 10 mg  10 mg Oral q AM Mirage Pfefferkorn, DO   10 mg at 07/26/23 8657   hydrOXYzine  (ATARAX ) tablet 25 mg  25 mg Oral TID PRN Hoang, Daniela B, MD   25 mg at 07/26/23 0941   LORazepam  (ATIVAN ) tablet 1 mg  1 mg Oral Q6H PRN Hoang, Daniela B, MD       magnesium  hydroxide (MILK OF MAGNESIA) suspension 30 mL  30 mL Oral Daily PRN Hoang, Daniela B, MD       multivitamin with minerals tablet 1 tablet  1 tablet Oral Daily Hoang, Daniela B, MD   1 tablet at 07/26/23 0929   naltrexone (DEPADE) tablet 50 mg  50 mg Oral QHS Van Ehlert, DO   50 mg at 07/26/23 0941   nicotine  (NICODERM CQ  - dosed in mg/24 hours) patch 21 mg  21 mg Transdermal Q0600 Hoang, Daniela B, MD   21 mg at 07/26/23 8469   ondansetron  (ZOFRAN -ODT) disintegrating tablet 4 mg  4 mg Oral Q6H PRN Hoang, Daniela B, MD       thiamine  (VITAMIN B1) tablet 100 mg  100 mg Oral Daily Hoang, Daniela B, MD   100 mg at 07/26/23 6295   traZODone  (DESYREL ) tablet 50 mg  50 mg Oral QHS PRN Hoang, Daniela B, MD   50 mg at  07/25/23 2108   Current Outpatient Medications  Medication Sig Dispense Refill   escitalopram (LEXAPRO) 10 MG tablet Take 1 tablet (10 mg total) by mouth at bedtime. 30 tablet 0   hydrOXYzine  (ATARAX ) 25 MG tablet Take 1-2 tablets (25-50 mg total) by mouth 3 (three) times daily as needed for anxiety. 90 tablet 0   Multiple Vitamin (MULTIVITAMIN WITH MINERALS) TABS tablet Take 1 tablet  by mouth daily. 30 tablet 0   naltrexone (DEPADE) 50 MG tablet Take 1 tablet (50 mg total) by mouth at bedtime. 30 tablet 0   nicotine  (NICODERM CQ  - DOSED IN MG/24 HOURS) 21 mg/24hr patch Place 1 patch (21 mg total) onto the skin every morning. Remove before going to bed to prevent nightmares 28 patch 0   thiamine  (VITAMIN B-1) 100 MG tablet Take 1 tablet (100 mg total) by mouth daily. 30 tablet 0   traZODone  (DESYREL ) 50 MG tablet Take 0.5-1 tablets (25-50 mg total) by mouth at bedtime as needed for sleep. 30 tablet 0   PTA Medications:  Facility Ordered Medications  Medication   [COMPLETED] LORazepam  (ATIVAN ) tablet 1 mg   acetaminophen  (TYLENOL ) tablet 650 mg   alum & mag hydroxide-simeth (MAALOX/MYLANTA) 200-200-20 MG/5ML suspension 30 mL   magnesium  hydroxide (MILK OF MAGNESIA) suspension 30 mL   haloperidol  (HALDOL ) tablet 5 mg   And   diphenhydrAMINE  (BENADRYL ) capsule 50 mg   haloperidol  lactate (HALDOL ) injection 5 mg   And   diphenhydrAMINE  (BENADRYL ) injection 50 mg   And   LORazepam  (ATIVAN ) injection 2 mg   haloperidol  lactate (HALDOL ) injection 10 mg   And   diphenhydrAMINE  (BENADRYL ) injection 50 mg   And   LORazepam  (ATIVAN ) injection 2 mg   hydrOXYzine  (ATARAX ) tablet 25 mg   traZODone  (DESYREL ) tablet 50 mg   nicotine  (NICODERM CQ  - dosed in mg/24 hours) patch 21 mg   multivitamin with minerals tablet 1 tablet   LORazepam  (ATIVAN ) tablet 1 mg   ondansetron  (ZOFRAN -ODT) disintegrating tablet 4 mg   thiamine  (VITAMIN B1) tablet 100 mg   [COMPLETED] LORazepam  (ATIVAN ) tablet 1 mg    Followed by   [COMPLETED] LORazepam  (ATIVAN ) tablet 1 mg   Followed by   [COMPLETED] LORazepam  (ATIVAN ) tablet 1 mg   [COMPLETED] potassium chloride  (KLOR-CON ) packet 40 mEq   [COMPLETED] naltrexone (DEPADE) tablet 25 mg   Followed by   naltrexone (DEPADE) tablet 50 mg   escitalopram (LEXAPRO) tablet 10 mg   PTA Medications  Medication Sig   escitalopram (LEXAPRO) 10 MG tablet Take 1 tablet (10 mg total) by mouth at bedtime.   Multiple Vitamin (MULTIVITAMIN WITH MINERALS) TABS tablet Take 1 tablet by mouth daily.   naltrexone (DEPADE) 50 MG tablet Take 1 tablet (50 mg total) by mouth at bedtime.   nicotine  (NICODERM CQ  - DOSED IN MG/24 HOURS) 21 mg/24hr patch Place 1 patch (21 mg total) onto the skin every morning. Remove before going to bed to prevent nightmares   thiamine  (VITAMIN B-1) 100 MG tablet Take 1 tablet (100 mg total) by mouth daily.   hydrOXYzine  (ATARAX ) 25 MG tablet Take 1-2 tablets (25-50 mg total) by mouth 3 (three) times daily as needed for anxiety.   traZODone  (DESYREL ) 50 MG tablet Take 0.5-1 tablets (25-50 mg total) by mouth at bedtime as needed for sleep.      03/22/2019    9:18 AM 03/22/2019    8:57 AM  Depression screen PHQ 2/9  Decreased Interest 0 0  Down, Depressed, Hopeless 0 0  PHQ - 2 Score 0 0  Altered sleeping 0   Tired, decreased energy 0   Change in appetite 0   Feeling bad or failure about yourself  0   Trouble concentrating 0   Moving slowly or fidgety/restless 0   Suicidal thoughts 0   PHQ-9 Score 0    Flowsheet Row ED from [facility]24/20[facility] from 07/23/2023 in [facility] ED from 07/21/2023 in [facility] Emergency Department at [facility]  C-SSRS RISK CATEGORY No Risk No Risk No Risk      Musculoskeletal  Strength & Muscle Tone: within normal limits Gait & Station: normal Patient leans: N/A  Psychiatric Specialty Exam  Presentation General  Appearance:Appropriate for Environment, Casual, Fairly Groomed Eye Contact:Fair Speech:Clear and Coherent, Normal Rate Volume:Normal Handedness:Right  Mood and Affect  Mood:Anxious Affect:Appropriate, Congruent, Restricted  Thought Process  Thought Process:Coherent, Goal Directed, Linear Descriptions of Associations:Intact  Thought Content Suicidal Thoughts:No Homicidal Thoughts:No Hallucinations:None Ideas of Reference:None Thought Content:Rumination, WDL  Sensorium  Memory:Immediate Good Judgment:Fair Insight:Shallow  Executive Functions  Orientation:Full (Time, Place and Person) Language:Good Concentration:Good Attention:Good Recall:Good Fund of Knowledge:Good  Psychomotor Activity  Psychomotor Activity:Psychomotor Activity: Normal  Assets  Assets:Communication Skills, Desire for Improvement, Resilience  Sleep  Quality:Fair  Physical Exam  Physical Exam Vitals and nursing note reviewed.  Constitutional:      General: He is not in acute distress.    Appearance: Normal appearance. He is not ill-appearing, toxic-appearing or diaphoretic.  HENT:     Head: Normocephalic and atraumatic.  Eyes:     Conjunctiva/sclera: Conjunctivae normal.  Pulmonary:     Effort: Pulmonary effort is normal. No respiratory distress.  Neurological:     General: No focal deficit present.     Mental Status: He is alert and oriented to person, place, and time.     Gait: Gait normal.    Blood pressure (!) 136/94, pulse 94, temperature 98 F (36.7 C), temperature source Oral, resp. rate 16, SpO2 98%. There is no height or weight on file to calculate BMI.  Demographic Factors:  Male and Caucasian  Loss Factors: Loss of significant relationship  Historical Factors: NA  Risk Reduction Factors:   Responsible for children under 59 years of age, Sense of responsibility to family, Employed, Living with another person, especially a relative, and Positive social  support  Continued Clinical Symptoms:  Alcohol/Substance Abuse/Dependencies  Cognitive Features That Contribute To Risk:  Loss of executive function    Suicide Risk:  Minimal: No identifiable suicidal ideation.  Patients presenting with no risk factors but with morbid ruminations; may be classified as minimal risk based on the severity of the depressive symptoms   Plan Of Care/Follow-up recommendations:  Activity as tolerated. Diet as recommended by PCP. Keep all scheduled follow-up appointments as recommended.  Patient is instructed to take all prescribed medications as recommended. Report any side effects or adverse reactions to your outpatient psychiatrist. Patient is instructed to abstain from alcohol and illegal drugs while on prescription medications. In the event of worsening symptoms, patient is instructed to call the crisis hotline, 911, or go to the nearest emergency department for evaluation and treatment.  Prescriptions given at discharge. Patient agreeable to plan. Given opportunity to ask questions. Appears to feel comfortable with discharge.  Patient is also instructed prior to discharge to: Take all medications as prescribed by mental healthcare provider. Report any adverse effects and or reactions from the medicines to outpatient provider promptly. Patient has been instructed & cautioned: To not engage in alcohol and or illegal drug use while on prescription medicines. In the event of worsening symptoms,  patient is instructed to call the crisis hotline, 911 and or go to the nearest ED for appropriate evaluation and treatment of symptoms. To follow-up with primary care provider for other medical issues, concerns and or health care  needs  The patient was evaluated each day by a clinical provider to ascertain response to treatment. Improvement was noted by the patient's report of decreasing symptoms, improved sleep and appetite, affect, medication tolerance, behavior, and  participation in unit programming.  Patient was asked each day to complete a self inventory noting mood, mental status, pain, new symptoms, anxiety and concerns.  Patient responded well to medication and being in a therapeutic and supportive environment. Positive and appropriate behavior was noted and the patient was motivated for recovery. The patient worked closely with the treatment team and case manager to develop a discharge plan with appropriate goals. Coping skills, problem solving as well as relaxation therapies were also part of the unit programming.  By the day of discharge patient was in much improved condition than upon admission.  Symptoms were reported as significantly decreased or resolved completely. The patient was motivated to continue taking medication with a goal of continued improvement in mental health.    Disposition:  Home to family - wife, daughter. To sort out things at home then call [facility] Donavon Fudge, DO Psych Resident, PGY-3

## 2023-07-26 NOTE — ED Notes (Signed)
 Patient is sleeping. Respirations equal and unlabored, skin warm and dry. No change in assessment or acuity. Routine safety checks conducted according to facility protocol.

## 2023-11-13 ENCOUNTER — Ambulatory Visit: Admitting: Internal Medicine
